# Patient Record
Sex: Male | Born: 1945 | Race: White | Hispanic: No | Marital: Single | Smoking: Never smoker
Health system: Southern US, Community
[De-identification: ages and names within clinical notes are randomized; demographics above are authoritative.]

## PROBLEM LIST (undated history)

## (undated) DIAGNOSIS — I10 Essential (primary) hypertension: Secondary | ICD-10-CM

## (undated) DIAGNOSIS — E785 Hyperlipidemia, unspecified: Secondary | ICD-10-CM

## (undated) DIAGNOSIS — E119 Type 2 diabetes mellitus without complications: Secondary | ICD-10-CM

## (undated) HISTORY — PX: KNEE SURGERY: SHX244

## (undated) HISTORY — PX: PACEMAKER PLACEMENT: SHX43

## (undated) HISTORY — PX: CARDIAC SURGERY: SHX584

## (undated) HISTORY — PX: ABDOMINAL SURGERY: SHX537

---

## 2017-01-13 DIAGNOSIS — G4733 Obstructive sleep apnea (adult) (pediatric): Secondary | ICD-10-CM | POA: Insufficient documentation

## 2017-01-13 DIAGNOSIS — I10 Essential (primary) hypertension: Secondary | ICD-10-CM | POA: Insufficient documentation

## 2017-01-13 DIAGNOSIS — I2510 Atherosclerotic heart disease of native coronary artery without angina pectoris: Secondary | ICD-10-CM | POA: Insufficient documentation

## 2017-01-13 DIAGNOSIS — E119 Type 2 diabetes mellitus without complications: Secondary | ICD-10-CM | POA: Insufficient documentation

## 2017-01-21 ENCOUNTER — Emergency Department (HOSPITAL_COMMUNITY): Payer: Medicare PPO

## 2017-01-21 ENCOUNTER — Emergency Department (HOSPITAL_COMMUNITY)
Admission: EM | Admit: 2017-01-21 | Discharge: 2017-01-21 | Disposition: A | Discharge status: Other Acute Inpatient Hospital | Payer: Medicare PPO | Attending: Emergency Medicine | Admitting: Emergency Medicine

## 2017-01-21 ENCOUNTER — Telehealth (HOSPITAL_BASED_OUTPATIENT_CLINIC_OR_DEPARTMENT_OTHER): Payer: Self-pay | Admitting: *Deleted

## 2017-01-21 ENCOUNTER — Encounter (HOSPITAL_COMMUNITY): Payer: Self-pay | Admitting: Emergency Medicine

## 2017-01-21 DIAGNOSIS — Z951 Presence of aortocoronary bypass graft: Secondary | ICD-10-CM

## 2017-01-21 DIAGNOSIS — A419 Sepsis, unspecified organism: Secondary | ICD-10-CM | POA: Diagnosis not present

## 2017-01-21 DIAGNOSIS — R509 Fever, unspecified: Secondary | ICD-10-CM | POA: Diagnosis present

## 2017-01-21 DIAGNOSIS — E119 Type 2 diabetes mellitus without complications: Secondary | ICD-10-CM | POA: Insufficient documentation

## 2017-01-21 HISTORY — DX: Type 2 diabetes mellitus without complications: E11.9

## 2017-01-21 LAB — BLOOD CULTURE ID PANEL (REFLEXED)
Acinetobacter baumannii: NOT DETECTED
CANDIDA ALBICANS: NOT DETECTED
CANDIDA GLABRATA: NOT DETECTED
Candida krusei: NOT DETECTED
Candida parapsilosis: NOT DETECTED
Candida tropicalis: NOT DETECTED
ENTEROBACTER CLOACAE COMPLEX: NOT DETECTED
ENTEROBACTERIACEAE SPECIES: NOT DETECTED
ENTEROCOCCUS SPECIES: NOT DETECTED
Escherichia coli: NOT DETECTED
Haemophilus influenzae: NOT DETECTED
Klebsiella oxytoca: NOT DETECTED
Klebsiella pneumoniae: NOT DETECTED
LISTERIA MONOCYTOGENES: NOT DETECTED
Methicillin resistance: NOT DETECTED
NEISSERIA MENINGITIDIS: NOT DETECTED
Proteus species: NOT DETECTED
Pseudomonas aeruginosa: NOT DETECTED
STREPTOCOCCUS AGALACTIAE: NOT DETECTED
STREPTOCOCCUS PYOGENES: NOT DETECTED
STREPTOCOCCUS SPECIES: NOT DETECTED
Serratia marcescens: NOT DETECTED
Staphylococcus aureus (BCID): DETECTED — AB
Staphylococcus species: DETECTED — AB
Streptococcus pneumoniae: NOT DETECTED

## 2017-01-21 LAB — URINALYSIS, ROUTINE W REFLEX MICROSCOPIC
BACTERIA UA: NONE SEEN
Bilirubin Urine: NEGATIVE
Glucose, UA: 150 mg/dL — AB
HGB URINE DIPSTICK: NEGATIVE
Ketones, ur: 20 mg/dL — AB
LEUKOCYTES UA: NEGATIVE
Nitrite: NEGATIVE
PROTEIN: 30 mg/dL — AB
SPECIFIC GRAVITY, URINE: 1.028 (ref 1.005–1.030)
pH: 5 (ref 5.0–8.0)

## 2017-01-21 LAB — CBC WITH DIFFERENTIAL/PLATELET
BASOS PCT: 0 %
Basophils Absolute: 0 10*3/uL (ref 0.0–0.1)
EOS ABS: 0 10*3/uL (ref 0.0–0.7)
Eosinophils Relative: 0 %
HEMATOCRIT: 26.3 % — AB (ref 39.0–52.0)
Hemoglobin: 9.1 g/dL — ABNORMAL LOW (ref 13.0–17.0)
Lymphocytes Relative: 5 %
Lymphs Abs: 0.8 10*3/uL (ref 0.7–4.0)
MCH: 29.6 pg (ref 26.0–34.0)
MCHC: 34.6 g/dL (ref 30.0–36.0)
MCV: 85.7 fL (ref 78.0–100.0)
MONO ABS: 0.8 10*3/uL (ref 0.1–1.0)
MONOS PCT: 5 %
NEUTROS ABS: 15.1 10*3/uL — AB (ref 1.7–7.7)
Neutrophils Relative %: 90 %
PLATELETS: 176 10*3/uL (ref 150–400)
RBC: 3.07 MIL/uL — ABNORMAL LOW (ref 4.22–5.81)
RDW: 14.4 % (ref 11.5–15.5)
WBC: 16.7 10*3/uL — AB (ref 4.0–10.5)

## 2017-01-21 LAB — I-STAT ARTERIAL BLOOD GAS, ED
Acid-base deficit: 8 mmol/L — ABNORMAL HIGH (ref 0.0–2.0)
Bicarbonate: 15.6 mmol/L — ABNORMAL LOW (ref 20.0–28.0)
O2 Saturation: 96 %
PCO2 ART: 25.4 mmHg — AB (ref 32.0–48.0)
PH ART: 7.398 (ref 7.350–7.450)
Patient temperature: 98.6
TCO2: 16 mmol/L — ABNORMAL LOW (ref 22–32)
pO2, Arterial: 79 mmHg — ABNORMAL LOW (ref 83.0–108.0)

## 2017-01-21 LAB — COMPREHENSIVE METABOLIC PANEL
ALBUMIN: 2.6 g/dL — AB (ref 3.5–5.0)
ALK PHOS: 46 U/L (ref 38–126)
ALT: 29 U/L (ref 17–63)
AST: 34 U/L (ref 15–41)
Anion gap: 12 (ref 5–15)
BILIRUBIN TOTAL: 1.6 mg/dL — AB (ref 0.3–1.2)
BUN: 23 mg/dL — ABNORMAL HIGH (ref 6–20)
CALCIUM: 7.9 mg/dL — AB (ref 8.9–10.3)
CO2: 20 mmol/L — AB (ref 22–32)
CREATININE: 1.05 mg/dL (ref 0.61–1.24)
Chloride: 94 mmol/L — ABNORMAL LOW (ref 101–111)
GFR calc non Af Amer: >60 mL/min (ref >60)
GLUCOSE: 303 mg/dL — AB (ref 65–99)
Potassium: 3.4 mmol/L — ABNORMAL LOW (ref 3.5–5.1)
Sodium: 126 mmol/L — ABNORMAL LOW (ref 135–145)
TOTAL PROTEIN: 5.7 g/dL — AB (ref 6.5–8.1)

## 2017-01-21 LAB — PROTIME-INR
INR: 1.1
Prothrombin Time: 14.1 seconds (ref 11.4–15.2)

## 2017-01-21 LAB — I-STAT CG4 LACTIC ACID, ED
LACTIC ACID, VENOUS: 1.66 mmol/L (ref 0.5–1.9)
Lactic Acid, Venous: 2.23 mmol/L (ref 0.5–1.9)

## 2017-01-21 LAB — PROCALCITONIN: PROCALCITONIN: 0.88 ng/mL

## 2017-01-21 LAB — TROPONIN I: Troponin I: 0.03 ng/mL (ref <0.03)

## 2017-01-21 MED ORDER — PIPERACILLIN-TAZOBACTAM 3.375 G IVPB: 3.3750 g | Freq: Three times a day (TID) | INTRAVENOUS | Status: DC

## 2017-01-21 MED ORDER — VANCOMYCIN HCL IN DEXTROSE 1-5 GM/200ML-% IV SOLN
1000.0000 mg | Freq: Once | INTRAVENOUS | Status: DC
  Filled 2017-01-21: qty 200

## 2017-01-21 MED ORDER — VANCOMYCIN HCL 10 G IV SOLR: 1250.0000 mg | Freq: Two times a day (BID) | INTRAVENOUS | Status: DC

## 2017-01-21 MED ORDER — PIPERACILLIN-TAZOBACTAM 3.375 G IVPB 30 MIN
3.3750 g | Freq: Once | INTRAVENOUS | Status: AC
  Administered 2017-01-21: 3.375 g via INTRAVENOUS
  Filled 2017-01-21: qty 50

## 2017-01-21 MED ORDER — SODIUM CHLORIDE 0.9 % IV BOLUS (SEPSIS)
1000.0000 mL | Freq: Once | INTRAVENOUS | Status: AC
  Administered 2017-01-21: 1000 mL via INTRAVENOUS

## 2017-01-21 MED ORDER — SODIUM CHLORIDE 0.9 % IV BOLUS (SEPSIS): 1000.0000 mL | Freq: Once | INTRAVENOUS | Status: DC

## 2017-01-21 MED ORDER — IOPAMIDOL (ISOVUE-370) INJECTION 76%
INTRAVENOUS | Status: AC
  Administered 2017-01-21: 100 mL
  Filled 2017-01-21: qty 100

## 2017-01-21 MED ORDER — VANCOMYCIN HCL 10 G IV SOLR
2000.0000 mg | Freq: Once | INTRAVENOUS | Status: AC
  Administered 2017-01-21: 2000 mg via INTRAVENOUS
  Filled 2017-01-21: qty 2000

## 2017-01-21 NOTE — ED Notes (Signed)
Tried calling [facility] x2 to get more information about patient since they did not send any documentation, no one answered either time.   

## 2017-01-21 NOTE — ED Provider Notes (Signed)
[facility]-EMERGENCY DEPT Provider Note   CSN: [id number] Arrival date & time: 01/21/17  0416     History   Chief Complaint Chief Complaint  Patient presents with  . Fever  . Weakness    HPI Allen Alexander is a 71 y.o. male.  HPI Patient had four-vessel cardiac bypass surgery at [facility]. He was discharged yesterday to ECF. On arrival to [facility] patient was found to be ill in appearance. Temperature was 102.  He was sent to the emergency department. Patient reports he just doesn't feel good. He denies any focal pain. He denies chest pain or shortness of breath. Past Medical History:  Diagnosis Date  . Diabetes mellitus without complication (HCC)     There are no active problems to display for this patient.   Past Surgical History:  Procedure Laterality Date  . CARDIAC SURGERY         Home Medications    Prior to Admission medications   Not on File    Family History No family history on file.  Social History Social History  Substance Use Topics  . Smoking status: Not on file  . Smokeless tobacco: Not on file  . Alcohol use Not on file     Allergies   Patient has no allergy information on record.   Review of Systems Review of Systems  10 Systems reviewed and are negative for acute change except as noted in the HPI. Physical Exam Updated Vital Signs BP (!) 121/56   Pulse 97   Temp (!) 102.6 F (39.2 C) (Oral)   Resp 17   Ht  (1.803 m)   Wt 117.9 kg (260 lb)   SpO2 97%   BMI 36.26 kg/m   Physical Exam  Constitutional:  Patient is ill in appearance. He is moderately confused. Slightly diaphoretic. Tachypnea.  HENT:  Head: Normocephalic and atraumatic.  Nose: Nose normal.  Mouth/Throat: Oropharynx is clear and moist.  Eyes: Pupils are equal, round, and reactive to light. EOM are normal.  Neck: Neck supple.  Cardiovascular:  Tachycardia. Distant heart sounds. No gross rub murmur gallop.  Pulmonary/Chest:  Tachypnea.  No gross rail, rhonchi or wheeze. Chest wall incision is healing well. No significant erythema or drainage or discharge.  Abdominal: Soft. Bowel sounds are normal. He exhibits no distension. There is no guarding.  Genitourinary: Penis normal.  Musculoskeletal: Normal range of motion.  Patient has vein harvest site on the left lower extremity. Incisions are clean dry and intact. Some diffuse ecchymosis consistent with postoperative findings but no significant erythema or appearance of cellulitis. Mild peripheral edema bilaterally.  Neurological:  Patient is awake and interacts but he does seem mildly to moderately confused. He follows commands but seems very fatigued. No localizing motor dysfunction.  Skin: Skin is warm.  Skin is warm and diaphoretic. No evident rashes.     ED Treatments / Results  Labs (all labs ordered are listed, but only abnormal results are displayed) Labs Reviewed  COMPREHENSIVE METABOLIC PANEL - Abnormal; Notable for the following:       Result Value   Sodium 126 (*)    Potassium 3.4 (*)    Chloride 94 (*)    CO2 20 (*)    Glucose, Bld 303 (*)    BUN 23 (*)    Calcium 7.9 (*)    Total Protein 5.7 (*)    Albumin 2.6 (*)    Total Bilirubin 1.6 (*)    All other components within normal  limits  CBC WITH DIFFERENTIAL/PLATELET - Abnormal; Notable for the following:    WBC 16.7 (*)    RBC 3.07 (*)    Hemoglobin 9.1 (*)    HCT 26.3 (*)    Neutro Abs 15.1 (*)    All other components within normal limits  URINALYSIS, ROUTINE W REFLEX MICROSCOPIC - Abnormal; Notable for the following:    Glucose, UA 150 (*)    Ketones, ur 20 (*)    Protein, ur 30 (*)    Squamous Epithelial / LPF 0-5 (*)    All other components within normal limits  TROPONIN I - Abnormal; Notable for the following:    Troponin I 0.03 (*)    All other components within normal limits  I-STAT CG4 LACTIC ACID, ED - Abnormal; Notable for the following:    Lactic Acid, Venous 2.23 (*)    All other  components within normal limits  I-STAT ARTERIAL BLOOD GAS, ED - Abnormal; Notable for the following:    pCO2 arterial 25.4 (*)    pO2, Arterial 79.0 (*)    Bicarbonate 15.6 (*)    TCO2 16 (*)    Acid-base deficit 8.0 (*)    All other components within normal limits  CULTURE, BLOOD (ROUTINE X 2)  CULTURE, BLOOD (ROUTINE X 2)  PROTIME-INR  PROCALCITONIN  BLOOD GAS, ARTERIAL  I-STAT CG4 LACTIC ACID, ED  I-STAT CG4 LACTIC ACID, ED  I-STAT CG4 LACTIC ACID, ED    EKG  EKG Interpretation  Date/Time:  Friday January 21 2017 04:24:29 EDT Ventricular Rate:  115 PR Interval:    QRS Duration: 109 QT Interval:  383 QTC Calculation: 530 R Axis:   -163 Text Interpretation:  Sinus tachycardia with irregular rate Abnormal R-wave progression, late transition Inferior infarct, old Prolonged QT interval agree Confirmed by Arby Barrette [phone]) on 01/21/2017 4:33:28 AM Also confirmed by Arby Barrette [phone]), editor Madalyn Rob [phone])  on 01/21/2017 6:55:57 AM       Radiology Ct Angio Chest Pe W/cm &/or Wo Cm  Result Date: 01/21/2017 CLINICAL DATA:  Recent surgery.  Fever and cough. EXAM: CT ANGIOGRAPHY CHEST WITH CONTRAST TECHNIQUE: Multidetector CT imaging of the chest was performed using the standard protocol during bolus administration of intravenous contrast. Multiplanar CT image reconstructions and MIPs were obtained to evaluate the vascular anatomy. CONTRAST:  One hundred eighty-five cc Isovue 370. Initial, 100 cc was injected with sub optimal quality. Subsequently, 85 cc was injected. COMPARISON:  None. FINDINGS: Cardiovascular: There are no convincing filling defects in the pulmonary arterial tree to suggest acute pulmonary thromboembolism. There is mixing and streak artifact across some of the lower lobe vessels. For example see image 74 of series 17. There is low-density streaking across both the descending thoracic aorta and lower lobe pulmonary artery branches. There is no  obvious aortic dissection or aneurysm. There is some gas in the mediastinum consistent with recent sternotomy and mediastinal surgery. Great vessels are grossly patent within the confines of the examination. Saphenous vein bypass grafts are grossly opacified with contrast. Coronary artery calcifications are noted. A right subclavian pacemaker device is in place. Leads are noted in the right side of the heart. The heart is enlarged. Atherosclerotic calcifications in the aorta. Mediastinum/Nodes: Small mediastinal nodes. Gas is present consistent with recent surgery. Minimal peribronchovascular soft tissue thickening in the hilar regions is nonspecific. Patchy density at the right apex measures 8 mm. See image 31 of series 18. Lungs/Pleura: No pneumothorax. No pleural effusion. Dependent atelectasis. Upper  Abdomen: A cystic lesion in the pancreatic tail is partially imaged measuring 7 cm. Solid elements below the lower extent of the study may be present and complete imaging of this abnormality is recommended. Musculoskeletal: Post sternotomy with wires in place. No definite acute rib fracture. No vertebral compression deformity. Review of the MIP images confirms the above findings. IMPRESSION: No evidence of acute pulmonary thromboembolism. Postoperative changes in the mediastinum Partially imaged cystic lesion in the tail of the pancreas measures up to 7 cm. Solid elements may be present below the lower limit of the study and complete imaging of this abnormality is recommended. CT abdomen pelvis with contrast and pancreatic protocol is recommended. 8 mm indeterminate opacity at the right lung apex. Initial follow-up by chest CT without contrast is recommended in 3 months to confirm persistence. This recommendation follows the consensus statement: Recommendations for the Management of Subsolid Pulmonary Nodules Detected at CT: A Statement from the Fleischner Society as published in Radiology 2013; 266:304-317. Aortic  Atherosclerosis (ICD10-I70.0). Electronically Signed   By: Jolaine Click M.D.   On: 01/21/2017 06:58   Dg Chest Port 1 View  Result Date: 01/21/2017 CLINICAL DATA:  Acute onset of shortness of breath. Initial encounter. EXAM: PORTABLE CHEST 1 VIEW COMPARISON:  None. FINDINGS: The lungs are well-aerated and clear. There is no evidence of focal opacification, pleural effusion or pneumothorax. The cardiomediastinal silhouette is borderline enlarged. The patient is status post median sternotomy. A pacemaker is noted overlying the right chest wall, with leads ending overlying the right ventricle. No acute osseous abnormalities are seen. IMPRESSION: Borderline cardiomegaly.  Lungs remain grossly clear. Electronically Signed   By: Roanna Raider M.D.   On: 01/21/2017 05:15    Procedures Procedures (including critical care time) CRITICAL CARE Performed by: Arby Barrette   Total critical care time: 45 minutes  Critical care time was exclusive of separately billable procedures and treating other patients.  Critical care was necessary to treat or prevent imminent or life-threatening deterioration.  Critical care was time spent personally by me on the following activities: development of treatment plan with patient and/or surrogate as well as nursing, discussions with consultants, evaluation of patient's response to treatment, examination of patient, obtaining history from patient or surrogate, ordering and performing treatments and interventions, ordering and review of laboratory studies, ordering and review of radiographic studies, pulse oximetry and re-evaluation of patient's condition. Medications Ordered in ED Medications  sodium chloride 0.9 % bolus 1,000 mL (0 mLs Intravenous Stopped 01/21/17 0538)    And  sodium chloride 0.9 % bolus 1,000 mL (0 mLs Intravenous Stopped 01/21/17 0538)    And  sodium chloride 0.9 % bolus 1,000 mL (0 mLs Intravenous Stopped 01/21/17 0700)    And  sodium  chloride 0.9 % bolus 1,000 mL (not administered)  vancomycin (VANCOCIN) 1,250 mg in sodium chloride 0.9 % 250 mL IVPB (not administered)  piperacillin-tazobactam (ZOSYN) IVPB 3.375 g (not administered)  piperacillin-tazobactam (ZOSYN) IVPB 3.375 g (0 g Intravenous Stopped 01/21/17 0528)  vancomycin (VANCOCIN) 2,000 mg in sodium chloride 0.9 % 500 mL IVPB (0 mg Intravenous Stopped 01/21/17 0725)  iopamidol (ISOVUE-370) 76 % injection (100 mLs  Contrast Given 01/21/17 0617)     Initial Impression / Assessment and Plan / ED Course  I have reviewed the triage vital signs and the nursing notes.  Pertinent labs & imaging results that were available during my care of the patient were reviewed by me and considered in my medical decision making (see chart  for details).    Consult: Review Dr. Justice Britain CT surgeon at [facility], recommends CT PE study and continuing septic workup and treatment. Okay for admission to [facility] if needs further stabilization. Can continue to consult for ongoing guidance. Consult: Dr. Helyn Numbers intensivist. Patient was evaluated by PA-C in the emergency department and after discussing the case, they determined patient should be returned to [facility] for ongoing treatment by CT surgery for continuity of care.  07:22 patient improved compared to on arrival. Mental status is now much clearer. Patient reports he just doesn't feel good. No localizing pain. Consult: Review Dr. Nevada Crane CT surgery [facility]. Accepts the patient for transfer. Final Clinical Impressions(s) / ED Diagnoses   Final diagnoses:  Sepsis, due to unspecified organism [facility])  Hx of CABG  Patient presents as all and above. Findings are consistent with sepsis. Pulmonary embolus is ruled out. Etiology of sepsis uncertain at this time. With sepsis resuscitation measures, patient is showing improving mental status. Blood pressures have stabilized. Plan will be for transfer to [facility] for continuity of care with his cardiothoracic surgeon.  New Prescriptions New Prescriptions   No medications on file     Arby Barrette, MD 01/21/17 [phone]

## 2017-01-21 NOTE — ED Notes (Signed)
Family bedside, RN briefly updated, Provider notified and will be in shortly

## 2017-01-21 NOTE — Progress Notes (Signed)
Pharmacy Antibiotic Note  Allen Alexander is a 71 y.o. male admitted on 01/21/2017 with fevers, possible sepsis.  Pharmacy has been consulted for Vancomycin and Zosyn  dosing.  Plan: Vancomycin 2 g IV now, then 1250 mg IV q12h Zosyn 3.375 g IV q8h    Height:  (180.3 cm) Weight: 260 lb (117.9 kg) IBW/kg (Calculated) : 75.3  Temp (24hrs), Avg:102.6 F (39.2 C), Min:102.6 F (39.2 C), Max:102.6 F (39.2 C)   Recent Labs Lab 01/21/17 0429 01/21/17 0437  WBC 16.7*  --   CREATININE 1.05  --   LATICACIDVEN  --  2.23*    Estimated Creatinine Clearance: 84.2 mL/min (by C-G formula based on SCr of 1.05 mg/dL).    Not on File  Eddie Candle 01/21/2017 7:26 AM

## 2017-01-21 NOTE — ED Provider Notes (Signed)
9:30 a.m. Patient awake alert Glasgow Coma Score 15. Stable for transport [facility]   [place], [place], MD 01/21/17 [phone]

## 2017-01-21 NOTE — ED Notes (Signed)
Tried calling [facility] x2 to get more information about patient since they did not send any documentation, no one answered either time.

## 2017-01-21 NOTE — ED Triage Notes (Addendum)
Pt came from [facility] (having just moved in today.)  Was discharged from [facility] after having open heart surgery last week.  Pt complains of weakness and fever.  Pt was alert and oriented however had trouble given history.  Pt was given  of Tylenol by EMS

## 2017-01-23 LAB — CULTURE, BLOOD (ROUTINE X 2): Special Requests: ADEQUATE

## 2017-01-24 ENCOUNTER — Telehealth: Payer: Self-pay | Admitting: Emergency Medicine

## 2017-01-24 NOTE — Telephone Encounter (Signed)
Post ED Visit - Positive Culture Follow-up  Culture report reviewed by antimicrobial stewardship pharmacist:   Enzo Bi, Pharm.D.  Celedonio Miyamoto, Pharm.D., BCPS AQ-ID  Garvin Fila, Pharm.D., BCPS  Georgina Pillion, [address].D., BCPS  [place], [address].D., BCPS, AAHIVP  Estella Husk, Pharm.D., BCPS, AAHIVP  Lysle Pearl, PharmD, BCPS  Casilda Carls, PharmD, BCPS  Pollyann Samples, PharmD, BCPS  Positive blood culture Transferred to [facility] , faxed to [facility] @ [phone]  Berle Mull 01/24/2017, 1:10 PM

## 2017-07-05 ENCOUNTER — Telehealth (HOSPITAL_COMMUNITY): Payer: Self-pay

## 2017-07-05 NOTE — Telephone Encounter (Signed)
Called patient in regards to Cardiac Rehab - Scheduled orientation on 08/30/2017 at 8:30am. Patient will attend the 1:15pm exc class. Mailed packet.

## 2017-07-14 ENCOUNTER — Ambulatory Visit: Payer: Non-veteran care | Admitting: Podiatry

## 2017-07-21 ENCOUNTER — Ambulatory Visit (INDEPENDENT_AMBULATORY_CARE_PROVIDER_SITE_OTHER): Payer: No Typology Code available for payment source

## 2017-07-21 ENCOUNTER — Encounter: Payer: Self-pay | Admitting: Podiatry

## 2017-07-21 ENCOUNTER — Ambulatory Visit (INDEPENDENT_AMBULATORY_CARE_PROVIDER_SITE_OTHER): Payer: No Typology Code available for payment source | Admitting: Podiatry

## 2017-07-21 DIAGNOSIS — M79675 Pain in left toe(s): Secondary | ICD-10-CM

## 2017-07-21 DIAGNOSIS — M79674 Pain in right toe(s): Secondary | ICD-10-CM

## 2017-07-21 DIAGNOSIS — B351 Tinea unguium: Secondary | ICD-10-CM | POA: Diagnosis not present

## 2017-07-21 DIAGNOSIS — M2022 Hallux rigidus, left foot: Secondary | ICD-10-CM | POA: Diagnosis not present

## 2017-07-21 DIAGNOSIS — M2012 Hallux valgus (acquired), left foot: Secondary | ICD-10-CM | POA: Diagnosis not present

## 2017-07-21 DIAGNOSIS — E1149 Type 2 diabetes mellitus with other diabetic neurological complication: Secondary | ICD-10-CM

## 2017-07-24 NOTE — Progress Notes (Signed)
Subjective:   Patient ID: Allen Alexander, male   DOB: 72 y.o.   MRN: [id number]   HPI 72 year old male presents the office today for 2 concerns.  His primary concern is pain to the left bunion which is been ongoing for about 4-5 years and has been getting worse.  He states that time the area will swell and get very painful to the nail which shoes he wears.  Denies any recent injury or trauma denies any recent treatment for this other than change in shoes.  Also he presents today for concerns of thick, discolored toenails that he cannot trim himself.  Denies any redness or drainage from the toenail sites.  Does have neuropathy.  His last A1c was 7.6.  He denies any claudication symptoms.  Denies any ulceration.  He has no other concerns.   Review of Systems  All other systems reviewed and are negative.  Past Medical History:  Diagnosis Date  . Diabetes mellitus without complication [facility])     Past Surgical History:  Procedure Laterality Date  . CARDIAC SURGERY       Current Outpatient Medications:  .  polyethylene glycol (MIRALAX / GLYCOLAX) packet, Take 17 g by mouth., Disp: , Rfl:  .  potassium chloride (K-DUR,KLOR-CON) 10 MEQ tablet, Take by mouth., Disp: , Rfl:  .  aspirin 81 MG chewable tablet, Chew 162 mg by mouth daily., Disp: , Rfl:  .  atorvastatin (LIPITOR) 40 MG tablet, Take 40 mg by mouth daily., Disp: , Rfl:  .  Cholecalciferol (VITAMIN D) 2000 units CAPS, Take 2,000 Units by mouth every morning., Disp: , Rfl:  .  furosemide (LASIX) 20 MG tablet, Take 20 mg by mouth daily. For 7 days; started on 01-21-17, Disp: , Rfl:  .  Inositol Niacinate 100 MG TABS, Take 400 mg by mouth at bedtime., Disp: , Rfl:  .  insulin aspart (NOVOLOG) 100 UNIT/ML injection, Inject 2-10 Units into the skin 3 (three) times daily before meals. Sliding scale. 151-200=2 units. 201-250=4 units.  251-300= 6 units.  351-400=10 units. Greater than 400; call MD, Disp: , Rfl:  .  insulin glargine (LANTUS)  100 UNIT/ML injection, Inject 30 Units into the skin at bedtime., Disp: , Rfl:  .  metFORMIN (GLUCOPHAGE-XR) 500 MG 24 hr tablet, Take 2,000 mg by mouth daily with breakfast., Disp: , Rfl:  .  metoprolol tartrate (LOPRESSOR) 25 MG tablet, Take 25 mg by mouth 2 (two) times daily., Disp: , Rfl:  .  Niacin-Inositol 400-100 MG CAPS, Take by mouth., Disp: , Rfl:  .  sennosides-docusate sodium (SENOKOT-S) 8.6-50 MG tablet, Take 2 tablets by mouth at bedtime., Disp: , Rfl:   Allergies  Allergen Reactions  . Hydrochlorothiazide Other (See Comments)  . Lisinopril Other (See Comments)  . Niacin Itching    Pt states he can tolerate Niacin    Social History   Socioeconomic History  . Marital status: Single    Spouse name: Not on file  . Number of children: Not on file  . Years of education: Not on file  . Highest education level: Not on file  Occupational History  . Not on file  Social Needs  . Financial resource strain: Not on file  . Food insecurity:    Worry: Not on file    Inability: Not on file  . Transportation needs:    Medical: Not on file    Non-medical: Not on file  Tobacco Use  . Smoking status: Never Smoker  . Smokeless  tobacco: Never Used  Substance and Sexual Activity  . Alcohol use: Not Currently  . Drug use: Never  . Sexual activity: Not on file  Lifestyle  . Physical activity:    Days per week: Not on file    Minutes per session: Not on file  . Stress: Not on file  Relationships  . Social connections:    Talks on phone: Not on file    Gets together: Not on file    Attends religious service: Not on file    Active member of club or organization: Not on file    Attends meetings of clubs or organizations: Not on file    Relationship status: Not on file  . Intimate partner violence:    Fear of current or ex partner: Not on file    Emotionally abused: Not on file    Physically abused: Not on file    Forced sexual activity: Not on file  Other Topics Concern  .  Not on file  Social History Narrative  . Not on file         Objective:  Physical Exam  General: AAO x3, NAD  Dermatological:Nails are hypertrophic, dystrophic, brittle, discolored, elongated 10. No surrounding redness or drainage. Tenderness nails 1-5 bilaterally. No open lesions or pre-ulcerative lesions are identified today.  Vascular: Dorsalis Pedis artery and Posterior Tibial artery pedal pulses are 2/4 bilateral with immedate capillary fill time.  There is no pain with calf compression, swelling, warmth, erythema.   Neruologic: Sensation mildly decreased at the toes with Allen Alexander monofilament.  Musculoskeletal: There is minimal swelling to the area and there is mild erythema for which is been rubbing inside shoes.  There is almost no range of motion of the first MPJ present on the left foot.  Dorsal bony prominence is also palpable as well as the dorsal medial prominence.  Muscular strength 5/5 in all groups tested bilateral.  Gait: Unassisted, Nonantalgic.     Assessment:   72 year old male with hallux rigidus left foot; symptomatic onychomycosis     Plan:  -Treatment options discussed including all alternatives, risks, and complications -Etiology of symptoms were discussed -X-rays were obtained and reviewed with the patient.  Significant arthritic changes present of the first MPJ on the left foot there is dorsal spurring and osteophyte formation. -In regards to the hallux rigidus we discussed both conservative as well as surgical treatment options.  I did dispense today graphite insert inside of his shoes.  We also discussed changing shoes as well as offloading pads.  We also discussed surgical intervention but he has had a recent quadruple bypass with his heart.  He may need to have cardiac clearance prior to any surgical intervention.  We just discussed the first MPJ arthrodesis in the future should symptoms continue.  He has very minimal discomfort on exam today so  therefore we held off on steroid injection but should it become painful we can always do this. -Nails are sharply debrided x10 without any complications or bleeding.  Allen Alexander DPM

## 2017-08-30 ENCOUNTER — Encounter (HOSPITAL_COMMUNITY)
Admission: RE | Admit: 2017-08-30 | Discharge: 2017-08-30 | Disposition: A | Discharge status: Home / Self Care | Payer: Medicare PPO | Source: Ambulatory Visit | Attending: Cardiology | Admitting: Cardiology

## 2017-08-30 VITALS — Ht 70.0 in | Wt 278.7 lb

## 2017-08-30 DIAGNOSIS — Z951 Presence of aortocoronary bypass graft: Secondary | ICD-10-CM | POA: Insufficient documentation

## 2017-08-30 DIAGNOSIS — E119 Type 2 diabetes mellitus without complications: Secondary | ICD-10-CM | POA: Insufficient documentation

## 2017-08-31 NOTE — Progress Notes (Signed)
Allen Alexander 72 y.o. male DOB: 1945-09-16 MRN: [id number]      Nutrition Note  1. S/P CABG x 4    Past Medical History:  Diagnosis Date  . Diabetes mellitus without complication (HCC)    Meds reviewed. Novolog, Lantus, Metformin noted  HT: Ht Readings from Last 1 Encounters:  08/30/17  (1.778 m)    WT: Wt Readings from Last 5 Encounters:  08/30/17 278 lb 10.6 oz (126.4 kg)  01/21/17 260 lb (117.9 kg)     Body mass index is 39.98 kg/m.   Current tobacco use? No   Labs:  Lipid Panel  No results found for: CHOL, TRIG, HDL, CHOLHDL, VLDL, LDLCALC, LDLDIRECT  No results found for: HGBA1C CBG (last 3)  No results for input(s): GLUCAP in the last 72 hours.  Nutrition Note Spoke with pt. Nutrition plan and goals reviewed with pt. Pt is following Step 1 of the Therapeutic Lifestyle Changes diet. Pt wants to lose wt. Per discussion, pt's highest wt several years ago was 315 lb. Pt reports losing 45 lb from his highest wt and maintaining wt loss until recently. Wt loss tips reviewed. Pt is diabetic. No recent A1c noted. Pt reports his last A1c was 7.1. Pt checks CBG's 2-3 times a day. Fasting CBG's reportedly 155-185 mg/dL. Pt expressed understanding of the information reviewed. Pt aware of nutrition education classes offered and plans on attending nutrition classes.  Nutrition Diagnosis ? Food-and nutrition-related knowledge deficit related to lack of exposure to information as related to diagnosis of: ? CVD ? DM  ? Obesity related to excessive energy intake as evidenced by a Body mass index is 39.98 kg/m.  Nutrition Intervention ? Pt's individual nutrition plan and goals reviewed with pt.  Nutrition Goal(s):  ? Pt to identify and limit food sources of saturated fat, trans fat, and sodium ? Pt to identify food quantities necessary to achieve weight loss of 6-24 lb (2.7-10.9 kg) at graduation from cardiac rehab. Goal wt of 235 lb desired.   Plan:  Pt to attend  nutrition classes ? Nutrition I ? Nutrition II ? Portion Distortion  Will provide client-centered nutrition education as part of interdisciplinary care.   Monitor and evaluate progress toward nutrition goal with team.  Mickle Plumb, M.Ed, RD, LDN, CDE 08/31/2017 3:42 PM

## 2017-08-31 NOTE — Progress Notes (Signed)
Cardiac Individual Treatment Plan  Patient Details  Name: Allen Alexander MRN: [id number] Date of Birth: 03-29-1946 Referring Provider:   Flowsheet Row CARDIAC REHAB PHASE II ORIENTATION from 08/30/2017 in [facility] CARDIAC [facility]  Referring Provider  Humpreys,Holly MD ([facility]'s coverage)      Initial Encounter Date:  Flowsheet Row CARDIAC REHAB PHASE II ORIENTATION from 08/30/2017 in [facility] CARDIAC REHAB  Date  08/30/17  Referring Provider  Humpreys,Holly MD ([facility]'s coverage)      Visit Diagnosis: S/P CABG x 4  Patient's Home Medications on Admission:  Current Outpatient Medications:  .  aspirin 81 MG chewable tablet, Chew 162 mg by mouth daily., Disp: , Rfl:  .  atorvastatin (LIPITOR) 40 MG tablet, Take 40 mg by mouth daily., Disp: , Rfl:  .  Cholecalciferol (VITAMIN D) 2000 units CAPS, Take 2,000 Units by mouth every morning., Disp: , Rfl:  .  diphenhydrAMINE (BENADRYL) 25 MG tablet, Take 25 mg by mouth every 6 (six) hours as needed., Disp: , Rfl:  .  insulin aspart (NOVOLOG) 100 UNIT/ML injection, Inject 2-10 Units into the skin 3 (three) times daily before meals. Sliding scale. 151-200=2 units. 201-250=4 units.  251-300= 6 units.  351-400=10 units. Greater than 400; call MD, Disp: , Rfl:  .  insulin glargine (LANTUS) 100 UNIT/ML injection, Inject 25 Units into the skin at bedtime. , Disp: , Rfl:  .  isosorbide mononitrate (IMDUR) 60 MG 24 hr tablet, Take 60 mg by mouth daily., Disp: , Rfl:  .  Melatonin 10 MG TABS, Take 1 tablet by mouth at bedtime as needed., Disp: , Rfl:  .  metFORMIN (GLUCOPHAGE-XR) 500 MG 24 hr tablet, Take 2,000 mg by mouth daily with breakfast., Disp: , Rfl:  .  metoprolol tartrate (LOPRESSOR) 25 MG tablet, Take 12.5 mg by mouth 2 (two) times daily. , Disp: , Rfl:  .  Multiple Vitamins-Minerals (MULTIVITAMIN WITH MINERALS) tablet, Take 1 tablet by mouth daily., Disp: , Rfl:  .  niacin 100 MG tablet, Take 100 mg by  mouth at bedtime., Disp: , Rfl:  .  furosemide (LASIX) 20 MG tablet, Take 20 mg by mouth daily. For 7 days; started on 01-21-17, Disp: , Rfl:  .  potassium chloride (K-DUR,KLOR-CON) 10 MEQ tablet, Take by mouth., Disp: , Rfl:   Past Medical History: Past Medical History:  Diagnosis Date  . Diabetes mellitus without complication (HCC)     Tobacco Use: Social History   Tobacco Use  Smoking Status Never Smoker  Smokeless Tobacco Never Used    Labs: Recent Review Advice worker    Labs for ITP Cardiac and Pulmonary Rehab Latest Ref Rng & Units 01/21/2017   PHART 7.350 - 7.450 7.398   PCO2ART 32.0 - 48.0 mmHg 25.4(L)   HCO3 20.0 - 28.0 mmol/L 15.6(L)   TCO2 22 - 32 mmol/L 16(L)   ACIDBASEDEF 0.0 - 2.0 mmol/L 8.0(H)   O2SAT % 96.0      Capillary Blood Glucose: No results found for: GLUCAP   Exercise Target Goals: Date: 08/30/17  Exercise Program Goal: Individual exercise prescription set using results from initial 6 min walk test and THRR while considering  patient's activity barriers and safety.   Exercise Prescription Goal: Initial exercise prescription builds to 30-45 minutes a day of aerobic activity, 2-3 days per week.  Home exercise guidelines will be given to patient during program as part of exercise prescription that the participant will acknowledge.  Activity Barriers & Risk Stratification: Activity  Barriers & Cardiac Risk Stratification - 08/30/17 0926    Activity Barriers & Cardiac Risk Stratification          Activity Barriers  Balance Concerns;Joint Problems;Deconditioning;Muscular Weakness;Shortness of Breath;Arthritis;Other (comment)    Comments  B knee pain, B neuropathy     Cardiac Risk Stratification  High           6 Minute Walk: 6 Minute Walk    6 Minute Walk    Row Name 08/30/17 1137   Phase  Initial   Distance  1319 feet   Walk Time  6 minutes   # of Rest Breaks  0   MPH  2.5   METS  1.95   RPE  9   VO2 Peak  6.8   Symptoms  No    Resting HR  71 bpm   Resting BP  104/60   Resting Oxygen Saturation   96 %   Exercise Oxygen Saturation  during 6 min walk  93 %   Max Ex. HR  92 bpm   Max Ex. BP  122/60   2 Minute Post BP  100/60          Oxygen Initial Assessment:   Oxygen Re-Evaluation:   Oxygen Discharge (Final Oxygen Re-Evaluation):   Initial Exercise Prescription: Initial Exercise Prescription - 08/30/17 1100    Date of Initial Exercise RX and Referring Provider          Date  08/30/17    Referring Provider  Humpreys,Holly MD ([facility]'s coverage)        Treadmill          MPH  2    Grade  0    Minutes  15    METs  2.53        NuStep          Level  2    SPM  75    Minutes  15    METs  2        Arm Ergometer          Level  2    Watts  15    Minutes  15    METs  1.6        Prescription Details          Frequency (times per week)  3    Duration  Progress to 30 minutes of continuous aerobic without signs/symptoms of physical distress        Intensity          THRR 40-80% of Max Heartrate  59-118    Ratings of Perceived Exertion  11-15    Perceived Dyspnea  0-4        Progression          Progression  Continue to progress workloads to maintain intensity without signs/symptoms of physical distress.        Resistance Training          Training Prescription  Yes    Weight  3lbs    Reps  10-15           Perform Capillary Blood Glucose checks as needed.  Exercise Prescription Changes:   Exercise Comments:   Exercise Goals and Review: Exercise Goals    Exercise Goals    Row Name 08/30/17 1610 08/30/17 1132 08/30/17 1134   Increase Physical Activity  Yes  (Pended)   Yes  no documentation   Intervention  Provide advice, education, support and counseling about physical activity/exercise  needs.;Develop an individualized exercise prescription for aerobic and resistive training based on initial evaluation findings, risk stratification, comorbidities and participant's  personal goals.  (Pended)   Provide advice, education, support and counseling about physical activity/exercise needs.;Develop an individualized exercise prescription for aerobic and resistive training based on initial evaluation findings, risk stratification, comorbidities and participant's personal goals.  no documentation   Expected Outcomes  Short Term: Attend rehab on a regular basis to increase amount of physical activity.;Long Term: Exercising regularly at least 3-5 days a week.;Long Term: Add in home exercise to make exercise part of routine and to increase amount of physical activity.  (Pended)   Short Term: Attend rehab on a regular basis to increase amount of physical activity.;Long Term: Exercising regularly at least 3-5 days a week.;Long Term: Add in home exercise to make exercise part of routine and to increase amount of physical activity.  no documentation   Increase Strength and Stamina  Yes  (Pended)  Be able to walk 1.5 miles without difficulty or SOB  Yes  no documentation Be able to walk 1.5 miles without SOB or extreme fatigue/discomfort   Intervention  Provide advice, education, support and counseling about physical activity/exercise needs.;Develop an individualized exercise prescription for aerobic and resistive training based on initial evaluation findings, risk stratification, comorbidities and participant's personal goals.  (Pended)   Provide advice, education, support and counseling about physical activity/exercise needs.;Develop an individualized exercise prescription for aerobic and resistive training based on initial evaluation findings, risk stratification, comorbidities and participant's personal goals.  no documentation   Expected Outcomes  Short Term: Increase workloads from initial exercise prescription for resistance, speed, and METs.;Short Term: Perform resistance training exercises routinely during rehab and add in resistance training at home;Long Term: Improve  cardiorespiratory fitness, muscular endurance and strength as measured by increased METs and functional capacity ( )  (Pended)   Short Term: Increase workloads from initial exercise prescription for resistance, speed, and METs.;Short Term: Perform resistance training exercises routinely during rehab and add in resistance training at home;Long Term: Improve cardiorespiratory fitness, muscular endurance and strength as measured by increased METs and functional capacity ( )  no documentation   Able to understand and use rate of perceived exertion (RPE) scale  Yes  (Pended)   Yes  no documentation   Intervention  Provide education and explanation on how to use RPE scale  (Pended)   Provide education and explanation on how to use RPE scale  no documentation   Expected Outcomes  Short Term: Able to use RPE daily in rehab to express subjective intensity level;Long Term:  Able to use RPE to guide intensity level when exercising independently  (Pended)   Short Term: Able to use RPE daily in rehab to express subjective intensity level;Long Term:  Able to use RPE to guide intensity level when exercising independently  no documentation   Able to understand and use Dyspnea scale  Yes  (Pended)   Yes  no documentation   Intervention  Provide education and explanation on how to use Dyspnea scale  (Pended)   Provide education and explanation on how to use Dyspnea scale  no documentation   Expected Outcomes  Short Term: Able to use Dyspnea scale daily in rehab to express subjective sense of shortness of breath during exertion;Long Term: Able to use Dyspnea scale to guide intensity level when exercising independently  (Pended)   Short Term: Able to use Dyspnea scale daily in rehab to express subjective sense of shortness of breath during exertion;Long  Term: Able to use Dyspnea scale to guide intensity level when exercising independently  no documentation   Knowledge and understanding of Target Heart Rate Range (THRR)   Yes  (Pended)   Yes  no documentation   Intervention  Provide education and explanation of THRR including how the numbers were predicted and where they are located for reference  (Pended)   Provide education and explanation of THRR including how the numbers were predicted and where they are located for reference  no documentation   Expected Outcomes  Long Term: Able to use THRR to govern intensity when exercising independently;Short Term: Able to state/look up THRR;Short Term: Able to use daily as guideline for intensity in rehab  (Pended)   Short Term: Able to state/look up THRR;Long Term: Able to use THRR to govern intensity when exercising independently;Short Term: Able to use daily as guideline for intensity in rehab  no documentation   Able to check pulse independently  Yes  (Pended)   Yes  no documentation   Intervention  Provide education and demonstration on how to check pulse in carotid and radial arteries.;Review the importance of being able to check your own pulse for safety during independent exercise  (Pended)   Provide education and demonstration on how to check pulse in carotid and radial arteries.;Review the importance of being able to check your own pulse for safety during independent exercise  no documentation   Expected Outcomes  Short Term: Able to explain why pulse checking is important during independent exercise;Long Term: Able to check pulse independently and accurately  (Pended)   Short Term: Able to explain why pulse checking is important during independent exercise;Long Term: Able to check pulse independently and accurately  no documentation   Understanding of Exercise Prescription  Yes  (Pended)   Yes  no documentation   Intervention  Provide education, explanation, and written materials on patient's individual exercise prescription  (Pended)   Provide education, explanation, and written materials on patient's individual exercise prescription  no documentation   Expected Outcomes   Short Term: Able to explain program exercise prescription;Long Term: Able to explain home exercise prescription to exercise independently  (Pended)   Short Term: Able to explain program exercise prescription;Long Term: Able to explain home exercise prescription to exercise independently  no documentation          Exercise Goals Re-Evaluation :    Discharge Exercise Prescription (Final Exercise Prescription Changes):   Nutrition:  Target Goals: Understanding of nutrition guidelines, daily intake of sodium 1500mg , cholesterol 200mg , calories 30% from fat and 7% or less from saturated fats, daily to have 5 or more servings of fruits and vegetables.  Biometrics: Pre Biometrics - 08/30/17 1131    Pre Biometrics          Height   (1.778 m)    Weight  278 lb 10.6 oz (126.4 kg)    Waist Circumference  52 inches    Hip Circumference  51.5 inches    Waist to Hip Ratio  1.01 %    BMI (Calculated)  39.98    Triceps Skinfold  30 mm    % Body Fat  40.6 %    Grip Strength  32 kg    Flexibility  8 in    Single Leg Stand  3 seconds            Nutrition Therapy Plan and Nutrition Goals:   Nutrition Assessments:   Nutrition Goals Re-Evaluation:   Nutrition Goals Re-Evaluation:  Nutrition Goals Discharge (Final Nutrition Goals Re-Evaluation):   Psychosocial: Target Goals: Acknowledge presence or absence of significant depression and/or stress, maximize coping skills, provide positive support system. Participant is able to verbalize types and ability to use techniques and skills needed for reducing stress and depression.  Initial Review & Psychosocial Screening: Initial Psych Review & Screening - 08/31/17 0744    Initial Review          Current issues with  None Identified        Family Dynamics          Good Support System?  Yes        Barriers          Psychosocial barriers to participate in program  There are no identifiable barriers or psychosocial needs.         Screening Interventions          Interventions  Encouraged to exercise           Quality of Life Scores:  Scores of 19 and below usually indicate a poorer quality of life in these areas.  A difference of  2-3 points is a clinically meaningful difference.  A difference of 2-3 points in the total score of the Quality of Life Index has been associated with significant improvement in overall quality of life, self-image, physical symptoms, and general health in studies assessing change in quality of life.  PHQ-9: Recent Review Flowsheet Data    There is no flowsheet data to display.     Interpretation of Total Score  Total Score Depression Severity:  1-4 = Minimal depression, 5-9 = Mild depression, 10-14 = Moderate depression, 15-19 = Moderately severe depression, 20-27 = Severe depression   Psychosocial Evaluation and Intervention:   Psychosocial Re-Evaluation:   Psychosocial Discharge (Final Psychosocial Re-Evaluation):   Vocational Rehabilitation: Provide vocational rehab assistance to qualifying candidates.   Vocational Rehab Evaluation & Intervention:   Education: Education Goals: Education classes will be provided on a weekly basis, covering required topics. Participant will state understanding/return demonstration of topics presented.  Learning Barriers/Preferences: Learning Barriers/Preferences - 08/30/17 0926    Learning Barriers/Preferences          Learning Barriers  Hearing;Sight    Learning Preferences  Skilled Demonstration;Written Material           Education Topics: Count Your Pulse:  -Group instruction provided by verbal instruction, demonstration, patient participation and written materials to support subject.  Instructors address importance of being able to find your pulse and how to count your pulse when at home without a heart monitor.  Patients get hands on experience counting their pulse with staff help and individually.   Heart  Attack, Angina, and Risk Factor Modification:  -Group instruction provided by verbal instruction, video, and written materials to support subject.  Instructors address signs and symptoms of angina and heart attacks.    Also discuss risk factors for heart disease and how to make changes to improve heart health risk factors.   Functional Fitness:  -Group instruction provided by verbal instruction, demonstration, patient participation, and written materials to support subject.  Instructors address safety measures for doing things around the house.  Discuss how to get up and down off the floor, how to pick things up properly, how to safely get out of a chair without assistance, and balance training.   Meditation and Mindfulness:  -Group instruction provided by verbal instruction, patient participation, and written materials to support subject.  Instructor addresses importance of  mindfulness and meditation practice to help reduce stress and improve awareness.  Instructor also leads participants through a meditation exercise.    Stretching for Flexibility and Mobility:  -Group instruction provided by verbal instruction, patient participation, and written materials to support subject.  Instructors lead participants through series of stretches that are designed to increase flexibility thus improving mobility.  These stretches are additional exercise for major muscle groups that are typically performed during regular warm up and cool down.   Hands Only CPR:  -Group verbal, video, and participation provides a basic overview of AHA guidelines for community CPR. Role-play of emergencies allow participants the opportunity to practice calling for help and chest compression technique with discussion of AED use.   Hypertension: -Group verbal and written instruction that provides a basic overview of hypertension including the most recent diagnostic guidelines, risk factor reduction with self-care instructions  and medication management.    Nutrition I class: Heart Healthy Eating:  -Group instruction provided by PowerPoint slides, verbal discussion, and written materials to support subject matter. The instructor gives an explanation and review of the Therapeutic Lifestyle Changes diet recommendations, which includes a discussion on lipid goals, dietary fat, sodium, fiber, plant stanol/sterol esters, sugar, and the components of a well-balanced, healthy diet.   Nutrition II class: Lifestyle Skills:  -Group instruction provided by PowerPoint slides, verbal discussion, and written materials to support subject matter. The instructor gives an explanation and review of label reading, grocery shopping for heart health, heart healthy recipe modifications, and ways to make healthier choices when eating out.   Diabetes Question & Answer:  -Group instruction provided by PowerPoint slides, verbal discussion, and written materials to support subject matter. The instructor gives an explanation and review of diabetes co-morbidities, pre- and post-prandial blood glucose goals, pre-exercise blood glucose goals, signs, symptoms, and treatment of hypoglycemia and hyperglycemia, and foot care basics.   Diabetes Blitz:  -Group instruction provided by PowerPoint slides, verbal discussion, and written materials to support subject matter. The instructor gives an explanation and review of the physiology behind type 1 and type 2 diabetes, diabetes medications and rational behind using different medications, pre- and post-prandial blood glucose recommendations and Hemoglobin A1c goals, diabetes diet, and exercise including blood glucose guidelines for exercising safely.    Portion Distortion:  -Group instruction provided by PowerPoint slides, verbal discussion, written materials, and food models to support subject matter. The instructor gives an explanation of serving size versus portion size, changes in portions sizes over the  last 20 years, and what consists of a serving from each food group.   Stress Management:  -Group instruction provided by verbal instruction, video, and written materials to support subject matter.  Instructors review role of stress in heart disease and how to cope with stress positively.     Exercising on Your Own:  -Group instruction provided by verbal instruction, power point, and written materials to support subject.  Instructors discuss benefits of exercise, components of exercise, frequency and intensity of exercise, and end points for exercise.  Also discuss use of nitroglycerin and activating EMS.  Review options of places to exercise outside of rehab.  Review guidelines for sex with heart disease.   Cardiac Drugs I:  -Group instruction provided by verbal instruction and written materials to support subject.  Instructor reviews cardiac drug classes: antiplatelets, anticoagulants, beta blockers, and statins.  Instructor discusses reasons, side effects, and lifestyle considerations for each drug class.   Cardiac Drugs II:  -Group instruction provided by verbal instruction  and written materials to support subject.  Instructor reviews cardiac drug classes: angiotensin converting enzyme inhibitors (ACE-I), angiotensin II receptor blockers (ARBs), nitrates, and calcium channel blockers.  Instructor discusses reasons, side effects, and lifestyle considerations for each drug class.   Anatomy and Physiology of the Circulatory System:  Group verbal and written instruction and models provide basic cardiac anatomy and physiology, with the coronary electrical and arterial systems. Review of: AMI, Angina, Valve disease, Heart Failure, Peripheral Artery Disease, Cardiac Arrhythmia, Pacemakers, and the ICD.   Other Education:  -Group or individual verbal, written, or video instructions that support the educational goals of the cardiac rehab program.   Holiday Eating Survival Tips:  -Group  instruction provided by PowerPoint slides, verbal discussion, and written materials to support subject matter. The instructor gives patients tips, tricks, and techniques to help them not only survive but enjoy the holidays despite the onslaught of food that accompanies the holidays.   Knowledge Questionnaire Score:   Core Components/Risk Factors/Patient Goals at Admission: Personal Goals and Risk Factors at Admission - 08/30/17 1129    Core Components/Risk Factors/Patient Goals on Admission           Weight Management  Yes;Obesity    Intervention  Weight Management: Develop a combined nutrition and exercise program designed to reach desired caloric intake, while maintaining appropriate intake of nutrient and fiber, sodium and fats, and appropriate energy expenditure required for the weight goal.;Weight Management/Obesity: Establish reasonable short term and long term weight goals.;Weight Management: Provide education and appropriate resources to help participant work on and attain dietary goals.;Obesity: Provide education and appropriate resources to help participant work on and attain dietary goals.    Admit Weight  278 lb 10.6 oz (126.4 kg)    Goal Weight: Short Term  268 lb (121.6 kg)    Goal Weight: Long Term  235 lb (106.6 kg)    Expected Outcomes  Short Term: Continue to assess and modify interventions until short term weight is achieved;Long Term: Adherence to nutrition and physical activity/exercise program aimed toward attainment of established weight goal;Weight Loss: Understanding of general recommendations for a balanced deficit meal plan, which promotes 1-2 lb weight loss per week and includes a negative energy balance of [id number] kcal/d;Weight Maintenance: Understanding of the daily nutrition guidelines, which includes 25-35% calories from fat, 7% or less cal from saturated fats, less than  cholesterol, less than 1.5gm of sodium, & 5 or more servings of fruits and vegetables  daily;Understanding recommendations for meals to include 15-35% energy as protein, 25-35% energy from fat, 35-60% energy from carbohydrates, less than  of dietary cholesterol, 20-35 gm of total fiber daily;Understanding of distribution of calorie intake throughout the day with the consumption of 4-5 meals/snacks    Diabetes  Yes    Intervention  Provide education about signs/symptoms and action to take for hypo/hyperglycemia.;Provide education about proper nutrition, including hydration, and aerobic/resistive exercise prescription along with prescribed medications to achieve blood glucose in normal ranges: Fasting glucose 65-99 mg/dL    Expected Outcomes  Short Term: Participant verbalizes understanding of the signs/symptoms and immediate care of hyper/hypoglycemia, proper foot care and importance of medication, aerobic/resistive exercise and nutrition plan for blood glucose control.;Long Term: Attainment of HbA1C < 7%.    Hypertension  Yes    Intervention  Provide education on lifestyle modifcations including regular physical activity/exercise, weight management, moderate sodium restriction and increased consumption of fresh fruit, vegetables, and low fat dairy, alcohol moderation, and smoking cessation.;Monitor prescription use compliance.  Expected Outcomes  Short Term: Continued assessment and intervention until BP is < 140/73mm HG in hypertensive participants. < 130/89mm HG in hypertensive participants with diabetes, heart failure or chronic kidney disease.;Long Term: Maintenance of blood pressure at goal levels.    Lipids  Yes    Intervention  Provide education and support for participant on nutrition & aerobic/resistive exercise along with prescribed medications to achieve LDL 70mg , HDL >40mg .    Expected Outcomes  Short Term: Participant states understanding of desired cholesterol values and is compliant with medications prescribed. Participant is following exercise prescription and  nutrition guidelines.;Long Term: Cholesterol controlled with medications as prescribed, with individualized exercise RX and with personalized nutrition plan. Value goals: LDL < , HDL > 40 mg.           Core Components/Risk Factors/Patient Goals Review:    Core Components/Risk Factors/Patient Goals at Discharge (Final Review):    ITP Comments: ITP Comments    Row Name 08/30/17 0921   ITP Comments  Dr. Armanda Magic, Medical Director      Comments: Patient attended orientation from [phone]  to review rules and guidelines for program. Completed 6 minute walk test, Intitial ITP, and exercise prescription.  VSS. Telemetry-sinus rhythm  Asymptomatic.

## 2017-09-07 ENCOUNTER — Encounter (HOSPITAL_COMMUNITY): Payer: Medicare PPO

## 2017-09-07 ENCOUNTER — Encounter (HOSPITAL_COMMUNITY)
Admission: RE | Admit: 2017-09-07 | Discharge: 2017-09-07 | Disposition: A | Discharge status: Home / Self Care | Payer: Medicare PPO | Source: Ambulatory Visit | Attending: Cardiology | Admitting: Cardiology

## 2017-09-07 DIAGNOSIS — Z951 Presence of aortocoronary bypass graft: Secondary | ICD-10-CM | POA: Diagnosis not present

## 2017-09-07 DIAGNOSIS — E119 Type 2 diabetes mellitus without complications: Secondary | ICD-10-CM | POA: Diagnosis not present

## 2017-09-07 LAB — GLUCOSE, CAPILLARY
Glucose-Capillary: 109 mg/dL — ABNORMAL HIGH (ref 65–99)
Glucose-Capillary: 111 mg/dL — ABNORMAL HIGH (ref 65–99)

## 2017-09-07 NOTE — Progress Notes (Addendum)
Daily Session Note  Patient Details  Name: Allen Alexander MRN: [id number] Date of Birth: 12/15/1945 Referring Provider:     [place] [place] from 08/30/2017 in [place]  Referring Provider  Humpreys,Holly MD ([facility]'s coverage)      Encounter Date: 09/07/2017  Check In: Session Check In - 09/07/17 1354      Check-In   Location  [facility]    Staff Present  Barnet Pall, RN, BSN;Amber Fair, MS, ACSM RCEP, Exercise Physiologist;Olinty Celesta Aver, MS, ACSM CEP, Exercise Physiologist;Tyara Carol Ada, MS,ACSM CEP, Exercise Physiologist    Supervising physician immediately available to respond to emergencies  [facility] immediately available    Physician(s)  Dr. Maylene Roes    Medication changes reported      No    Fall or balance concerns reported     No    Tobacco Cessation  No Change    Warm-up and Cool-down  Performed as group-led instruction    Resistance Training Performed  No    VAD Patient?  No      Pain Assessment   Currently in Pain?  No/denies    Multiple Pain Sites  No       Capillary Blood Glucose: Results for orders placed or performed during the hospital encounter of 09/07/17 (from the past 24 hour(s))  Glucose, capillary     Status: Abnormal   Collection Time: 09/07/17  1:29 PM  Result Value Ref Range   Glucose-Capillary 109 (H) 65 - 99 mg/dL  Glucose, capillary     Status: Abnormal   Collection Time: 09/07/17  1:59 PM  Result Value Ref Range   Glucose-Capillary 111 (H) 65 - 99 mg/dL      Social History   Tobacco Use  Smoking Status Never Smoker  Smokeless Tobacco Never Used    Goals Met:  Exercise tolerated well  Goals Unmet:  Not Applicable  Comments: Chet started cardiac rehab today.  Pt tolerated light exercise without difficulty. VSS, telemetry-Sinus Rhythm BBB, asymptomatic.  Medication list reconciled. Pt denies barriers to medicaiton compliance.  PSYCHOSOCIAL ASSESSMENT:   PHQ-0. Pt exhibits positive coping skills, hopeful outlook with supportive family. No psychosocial needs identified at this time, no psychosocial interventions necessary.    Pt enjoys spending time with grandchildren and reading.   Pt oriented to exercise equipment and routine.    Understanding verbalized. Chet left the session early to pick up his grandson for tutoring.Barnet Pall, RN,BSN 09/07/2017 2:48 PM   Dr. Fransico Him is Medical Director for Cardiac Rehab at [facility].

## 2017-09-09 ENCOUNTER — Encounter (HOSPITAL_COMMUNITY): Payer: Medicare PPO

## 2017-09-09 ENCOUNTER — Encounter (HOSPITAL_COMMUNITY)
Admission: RE | Admit: 2017-09-09 | Discharge: 2017-09-09 | Disposition: A | Discharge status: Home / Self Care | Payer: Medicare PPO | Source: Ambulatory Visit | Attending: Cardiology | Admitting: Cardiology

## 2017-09-09 DIAGNOSIS — Z951 Presence of aortocoronary bypass graft: Secondary | ICD-10-CM

## 2017-09-09 LAB — GLUCOSE, CAPILLARY
GLUCOSE-CAPILLARY: 264 mg/dL — AB (ref 65–99)
GLUCOSE-CAPILLARY: 182 mg/dL — AB (ref 65–99)

## 2017-09-12 ENCOUNTER — Encounter (HOSPITAL_COMMUNITY): Payer: Medicare PPO

## 2017-09-12 ENCOUNTER — Encounter (HOSPITAL_COMMUNITY)
Admission: RE | Admit: 2017-09-12 | Discharge: 2017-09-12 | Disposition: A | Discharge status: Home / Self Care | Payer: Medicare PPO | Source: Ambulatory Visit | Attending: Cardiology | Admitting: Cardiology

## 2017-09-12 DIAGNOSIS — Z951 Presence of aortocoronary bypass graft: Secondary | ICD-10-CM | POA: Diagnosis present

## 2017-09-12 DIAGNOSIS — E119 Type 2 diabetes mellitus without complications: Secondary | ICD-10-CM | POA: Insufficient documentation

## 2017-09-12 LAB — GLUCOSE, CAPILLARY: GLUCOSE-CAPILLARY: 188 mg/dL — AB (ref 65–99)

## 2017-09-14 ENCOUNTER — Encounter (HOSPITAL_COMMUNITY)
Admission: RE | Admit: 2017-09-14 | Discharge: 2017-09-14 | Disposition: A | Discharge status: Home / Self Care | Payer: Medicare PPO | Source: Ambulatory Visit | Attending: Cardiology | Admitting: Cardiology

## 2017-09-14 ENCOUNTER — Encounter (HOSPITAL_COMMUNITY): Payer: Medicare PPO

## 2017-09-14 DIAGNOSIS — Z951 Presence of aortocoronary bypass graft: Secondary | ICD-10-CM

## 2017-09-14 LAB — GLUCOSE, CAPILLARY: GLUCOSE-CAPILLARY: 216 mg/dL — AB (ref 65–99)

## 2017-09-14 NOTE — Progress Notes (Signed)
Allen Alexander 73 y.o. male DOB: Jun 18, 1945 MRN: [id number]      Nutrition Note  Dx: CABG x 4 Labs:  CBG (last 3)  Recent Labs    09/12/17 1330  GLUCAP 188*   Nutrition Note Spoke with pt. Nutrition plan and survey reviewed with pt. Pt is following a Heart Healthy diet. Pt wants to lose wt. Per discussion, barriers to wt loss include mindless eating and portion sizes. Wt loss tips reviewed. Pt is diabetic. Diabetes previously discussed. Pt expressed understanding of the information reviewed. Pt aware of nutrition education classes offered and plans on attending nutrition classes.  Nutrition Diagnosis ? Food-and nutrition-related knowledge deficit related to lack of exposure to information as related to diagnosis of: ? CVD ? DM  ? Obesity related to excessive energy intake as evidenced by a BMI of 39.98  Nutrition Intervention ? Pt encouraged to drink water when he thinks he is hungry. ? Pt's individual nutrition plan reviewed with pt. ? Benefits of adopting Heart Healthy diet discussed when Medficts reviewed.    Nutrition Goal(s):  ? Pt to identify and limit food sources of saturated fat, trans fat, and sodium ? Pt to identify food quantities necessary to achieve weight loss of 6-24 lb (2.7-10.9 kg) at graduation from cardiac rehab. Goal wt of 235 lb desired.   Plan:  Pt to attend nutrition classes ? Nutrition I - met 09/13/17 ? Nutrition II ? Portion Distortion  Will provide client-centered nutrition education as part of interdisciplinary care.   Monitor and evaluate progress toward nutrition goal with team.  Derek Mound, M.Ed, RD, LDN, CDE 09/14/2017 1:57 PM

## 2017-09-16 ENCOUNTER — Encounter (HOSPITAL_COMMUNITY): Payer: Medicare PPO

## 2017-09-16 ENCOUNTER — Encounter (HOSPITAL_COMMUNITY)
Admission: RE | Admit: 2017-09-16 | Discharge: 2017-09-16 | Disposition: A | Discharge status: Home / Self Care | Payer: Medicare PPO | Source: Ambulatory Visit | Attending: Cardiology | Admitting: Cardiology

## 2017-09-16 ENCOUNTER — Encounter (HOSPITAL_COMMUNITY): Payer: Self-pay

## 2017-09-16 DIAGNOSIS — Z951 Presence of aortocoronary bypass graft: Secondary | ICD-10-CM

## 2017-09-16 LAB — GLUCOSE, CAPILLARY: GLUCOSE-CAPILLARY: 147 mg/dL — AB (ref 65–99)

## 2017-09-16 NOTE — Progress Notes (Signed)
Reviewed home exercise with pt today. [facility] in addition to coming to cardiac rehab. Reviewed THR, pulse, RPE, sign and symptoms, and when to call 911 or MD.  Also discussed weather considerations and indoor options.  Pt voiced understanding.    Braxson Hollingsworth [employer] PartsFair,MS,ACSM RCEP

## 2017-09-19 ENCOUNTER — Encounter (HOSPITAL_COMMUNITY): Payer: Medicare PPO

## 2017-09-19 ENCOUNTER — Encounter (HOSPITAL_COMMUNITY)
Admission: RE | Admit: 2017-09-19 | Discharge: 2017-09-19 | Disposition: A | Discharge status: Home / Self Care | Payer: Medicare PPO | Source: Ambulatory Visit | Attending: Cardiology | Admitting: Cardiology

## 2017-09-19 DIAGNOSIS — Z951 Presence of aortocoronary bypass graft: Secondary | ICD-10-CM | POA: Diagnosis not present

## 2017-09-19 LAB — GLUCOSE, CAPILLARY: GLUCOSE-CAPILLARY: 161 mg/dL — AB (ref 65–99)

## 2017-09-21 ENCOUNTER — Encounter (HOSPITAL_COMMUNITY)
Admission: RE | Admit: 2017-09-21 | Discharge: 2017-09-21 | Disposition: A | Discharge status: Home / Self Care | Payer: Medicare PPO | Source: Ambulatory Visit | Attending: Cardiology | Admitting: Cardiology

## 2017-09-21 ENCOUNTER — Encounter (HOSPITAL_COMMUNITY): Payer: Medicare PPO

## 2017-09-21 DIAGNOSIS — Z951 Presence of aortocoronary bypass graft: Secondary | ICD-10-CM | POA: Diagnosis not present

## 2017-09-21 LAB — GLUCOSE, CAPILLARY: Glucose-Capillary: 146 mg/dL — ABNORMAL HIGH (ref 65–99)

## 2017-09-22 NOTE — Progress Notes (Signed)
Cardiac Individual Treatment Plan  Patient Details  Name: Allen Alexander MRN: [id number] Date of Birth: [birth year] Referring Provider:   Flowsheet Row CARDIAC REHAB PHASE II ORIENTATION from 08/30/2017 in [facility] CARDIAC [facility]  Referring Provider  Humpreys,Holly MD ([facility]er's coverage)      Initial Encounter Date:  Flowsheet Row CARDIAC REHAB PHASE II ORIENTATION from 08/30/2017 in [facility] CARDIAC REHAB  Date  08/30/17  Referring Provider  Humpreys,Holly MD ([facility]er's coverage)      Visit Diagnosis: S/P CABG x 4  Patient's Home Medications on Admission:  Current Outpatient Medications:  .  aspirin 81 MG chewable tablet, Chew 162 mg by mouth daily., Disp: , Rfl:  .  atorvastatin (LIPITOR) 40 MG tablet, Take 40 mg by mouth daily., Disp: , Rfl:  .  Cholecalciferol (VITAMIN D) 2000 units CAPS, Take 2,000 Units by mouth every morning., Disp: , Rfl:  .  diphenhydrAMINE (BENADRYL) 25 MG tablet, Take 25 mg by mouth every 6 (six) hours as needed., Disp: , Rfl:  .  furosemide (LASIX) 20 MG tablet, Take 20 mg by mouth daily. For 7 days; started on 01-21-17, Disp: , Rfl:  .  insulin aspart (NOVOLOG) 100 UNIT/ML injection, Inject 2-10 Units into the skin 3 (three) times daily before meals. Sliding scale. 151-200=2 units. 201-250=4 units.  251-300= 6 units.  351-400=10 units. Greater than 400; call MD, Disp: , Rfl:  .  insulin glargine (LANTUS) 100 UNIT/ML injection, Inject 25 Units into the skin at bedtime. , Disp: , Rfl:  .  isosorbide mononitrate (IMDUR) 60 MG 24 hr tablet, Take 60 mg by mouth daily., Disp: , Rfl:  .  Melatonin 10 MG TABS, Take 1 tablet by mouth at bedtime as needed., Disp: , Rfl:  .  metFORMIN (GLUCOPHAGE-XR) 500 MG 24 hr tablet, Take 2,000 mg by mouth daily with breakfast., Disp: , Rfl:  .  metoprolol tartrate (LOPRESSOR) 25 MG tablet, Take 12.5 mg by mouth 2 (two) times daily. , Disp: , Rfl:  .  Multiple Vitamins-Minerals  (MULTIVITAMIN WITH MINERALS) tablet, Take 1 tablet by mouth daily., Disp: , Rfl:  .  niacin 100 MG tablet, Take 100 mg by mouth at bedtime., Disp: , Rfl:  .  potassium chloride (K-DUR,KLOR-CON) 10 MEQ tablet, Take by mouth., Disp: , Rfl:   Past Medical History: Past Medical History:  Diagnosis Date  . Diabetes mellitus without complication (HCC)     Tobacco Use: Social History   Tobacco Use  Smoking Status Never Smoker  Smokeless Tobacco Never Used    Labs: Recent Review Advice worker    Labs for ITP Cardiac and Pulmonary Rehab Latest Ref Rng & Units 01/21/2017   PHART 7.350 - 7.450 7.398   PCO2ART 32.0 - 48.0 mmHg 25.4(L)   HCO3 20.0 - 28.0 mmol/L 15.6(L)   TCO2 22 - 32 mmol/L 16(L)   ACIDBASEDEF 0.0 - 2.0 mmol/L 8.0(H)   O2SAT % 96.0      Capillary Blood Glucose: Lab Results  Component Value Date   GLUCAP 146 (H) 09/21/2017   GLUCAP 161 (H) 09/19/2017   GLUCAP 147 (H) 09/16/2017   GLUCAP 216 (H) 09/14/2017   GLUCAP 188 (H) 09/12/2017     Exercise Target Goals:    Exercise Program Goal: Individual exercise prescription set using results from initial 6 min walk test and THRR while considering  patient's activity barriers and safety.   Exercise Prescription Goal: Initial exercise prescription builds to 30-45 minutes a day of aerobic  activity, 2-3 days per week.  Home exercise guidelines will be given to patient during program as part of exercise prescription that the participant will acknowledge.  Activity Barriers & Risk Stratification: Activity Barriers & Cardiac Risk Stratification - 08/30/17 0926    Activity Barriers & Cardiac Risk Stratification          Activity Barriers  Balance Concerns;Joint Problems;Deconditioning;Muscular Weakness;Shortness of Breath;Arthritis;Other (comment)    Comments  B knee pain, B neuropathy     Cardiac Risk Stratification  High           6 Minute Walk: 6 Minute Walk    6 Minute Walk    Row Name 08/30/17 1137   Phase   Initial   Distance  1319 feet   Walk Time  6 minutes   # of Rest Breaks  0   MPH  2.5   METS  1.95   RPE  9   VO2 Peak  6.8   Symptoms  No   Resting HR  71 bpm   Resting BP  104/60   Resting Oxygen Saturation   96 %   Exercise Oxygen Saturation  during 6 min walk  93 %   Max Ex. HR  92 bpm   Max Ex. BP  122/60   2 Minute Post BP  100/60          Oxygen Initial Assessment:   Oxygen Re-Evaluation:   Oxygen Discharge (Final Oxygen Re-Evaluation):   Initial Exercise Prescription: Initial Exercise Prescription - 08/30/17 1100    Date of Initial Exercise RX and Referring Provider          Date  08/30/17    Referring Provider  Humpreys,Holly MD ([facility]'s coverage)        Treadmill          MPH  2    Grade  0    Minutes  15    METs  2.53        NuStep          Level  2    SPM  75    Minutes  15    METs  2        Arm Ergometer          Level  2    Watts  15    Minutes  15    METs  1.6        Prescription Details          Frequency (times per week)  3    Duration  Progress to 30 minutes of continuous aerobic without signs/symptoms of physical distress        Intensity          THRR 40-80% of Max Heartrate  59-118    Ratings of Perceived Exertion  11-15    Perceived Dyspnea  0-4        Progression          Progression  Continue to progress workloads to maintain intensity without signs/symptoms of physical distress.        Resistance Training          Training Prescription  Yes    Weight  3lbs    Reps  10-15           Perform Capillary Blood Glucose checks as needed.  Exercise Prescription Changes: Exercise Prescription Changes    Response to Exercise    Row Name 09/07/17 1203 09/12/17 1201   Blood Pressure (Admit)  110/70  120/60   Blood Pressure (Exercise)  146/70  128/70   Blood Pressure (Exit)  124/60  120/70   Heart Rate (Admit)  82 bpm  59 bpm   Heart Rate (Exercise)  127 bpm  107 bpm   Heart Rate (Exit)  82 bpm  62 bpm    Rating of Perceived Exertion (Exercise)  11  11   Symptoms  none  none   Comments  pt oriented to exercise equipment  no documentation   Duration  Continue with 30 min of aerobic exercise without signs/symptoms of physical distress.  Continue with 30 min of aerobic exercise without signs/symptoms of physical distress.   Intensity  THRR unchanged  THRR unchanged       Progression    Row Name 09/07/17 1203 09/12/17 1201   Progression  Continue to progress workloads to maintain intensity without signs/symptoms of physical distress.  no documentation       Resistance Training    Row Name 09/07/17 1203 09/12/17 1201   Training Prescription  No pt had to leave early  Yes   Weight  no documentation  3lbs   Reps  no documentation  10-15   Time  no documentation  10 Minutes       Treadmill    Row Name 09/07/17 1203 09/12/17 1201   MPH  2.2  2.5   Grade  0  0   Minutes  15  15   METs  2.69  2.91       NuStep    Row Name 09/07/17 1203 09/12/17 1201   Level  2  3   SPM  75  80   Minutes  15  15   METs  no documentation  2.8       Arm Ergometer    Row Name 09/07/17 1203 09/12/17 1201   Level  2  no documentation   Watts  15  no documentation   Minutes  15  no documentation   METs  1.6  no documentation          Exercise Comments: Exercise Comments    Row Name 09/20/17 1517   Exercise Comments  Reviewed METs and goals. Pt is tolerating light exercise in cardiac rehab; will continue to monitor activity levels.       Exercise Goals and Review: Exercise Goals    Exercise Goals    Row Name 08/30/17 6962 08/30/17 1132 08/30/17 1134   Increase Physical Activity  Yes  (Pended)   Yes  no documentation   Intervention  Provide advice, education, support and counseling about physical activity/exercise needs.;Develop an individualized exercise prescription for aerobic and resistive training based on initial evaluation findings, risk stratification, comorbidities and participant's  personal goals.  (Pended)   Provide advice, education, support and counseling about physical activity/exercise needs.;Develop an individualized exercise prescription for aerobic and resistive training based on initial evaluation findings, risk stratification, comorbidities and participant's personal goals.  no documentation   Expected Outcomes  Short Term: Attend rehab on a regular basis to increase amount of physical activity.;Long Term: Exercising regularly at least 3-5 days a week.;Long Term: Add in home exercise to make exercise part of routine and to increase amount of physical activity.  (Pended)   Short Term: Attend rehab on a regular basis to increase amount of physical activity.;Long Term: Exercising regularly at least 3-5 days a week.;Long Term: Add in home exercise to make exercise part of routine and to increase amount of  physical activity.  no documentation   Increase Strength and Stamina  Yes  (Pended)  Be able to walk 1.5 miles without difficulty or SOB  Yes  no documentation Be able to walk 1.5 miles without SOB or extreme fatigue/discomfort   Intervention  Provide advice, education, support and counseling about physical activity/exercise needs.;Develop an individualized exercise prescription for aerobic and resistive training based on initial evaluation findings, risk stratification, comorbidities and participant's personal goals.  (Pended)   Provide advice, education, support and counseling about physical activity/exercise needs.;Develop an individualized exercise prescription for aerobic and resistive training based on initial evaluation findings, risk stratification, comorbidities and participant's personal goals.  no documentation   Expected Outcomes  Short Term: Increase workloads from initial exercise prescription for resistance, speed, and METs.;Short Term: Perform resistance training exercises routinely during rehab and add in resistance training at home;Long Term: Improve  cardiorespiratory fitness, muscular endurance and strength as measured by increased METs and functional capacity ( )  (Pended)   Short Term: Increase workloads from initial exercise prescription for resistance, speed, and METs.;Short Term: Perform resistance training exercises routinely during rehab and add in resistance training at home;Long Term: Improve cardiorespiratory fitness, muscular endurance and strength as measured by increased METs and functional capacity ( )  no documentation   Able to understand and use rate of perceived exertion (RPE) scale  Yes  (Pended)   Yes  no documentation   Intervention  Provide education and explanation on how to use RPE scale  (Pended)   Provide education and explanation on how to use RPE scale  no documentation   Expected Outcomes  Short Term: Able to use RPE daily in rehab to express subjective intensity level;Long Term:  Able to use RPE to guide intensity level when exercising independently  (Pended)   Short Term: Able to use RPE daily in rehab to express subjective intensity level;Long Term:  Able to use RPE to guide intensity level when exercising independently  no documentation   Able to understand and use Dyspnea scale  Yes  (Pended)   Yes  no documentation   Intervention  Provide education and explanation on how to use Dyspnea scale  (Pended)   Provide education and explanation on how to use Dyspnea scale  no documentation   Expected Outcomes  Short Term: Able to use Dyspnea scale daily in rehab to express subjective sense of shortness of breath during exertion;Long Term: Able to use Dyspnea scale to guide intensity level when exercising independently  (Pended)   Short Term: Able to use Dyspnea scale daily in rehab to express subjective sense of shortness of breath during exertion;Long Term: Able to use Dyspnea scale to guide intensity level when exercising independently  no documentation   Knowledge and understanding of Target Heart Rate Range (THRR)   Yes  (Pended)   Yes  no documentation   Intervention  Provide education and explanation of THRR including how the numbers were predicted and where they are located for reference  (Pended)   Provide education and explanation of THRR including how the numbers were predicted and where they are located for reference  no documentation   Expected Outcomes  Long Term: Able to use THRR to govern intensity when exercising independently;Short Term: Able to state/look up THRR;Short Term: Able to use daily as guideline for intensity in rehab  (Pended)   Short Term: Able to state/look up THRR;Long Term: Able to use THRR to govern intensity when exercising independently;Short Term: Able to use daily as guideline  for intensity in rehab  no documentation   Able to check pulse independently  Yes  (Pended)   Yes  no documentation   Intervention  Provide education and demonstration on how to check pulse in carotid and radial arteries.;Review the importance of being able to check your own pulse for safety during independent exercise  (Pended)   Provide education and demonstration on how to check pulse in carotid and radial arteries.;Review the importance of being able to check your own pulse for safety during independent exercise  no documentation   Expected Outcomes  Short Term: Able to explain why pulse checking is important during independent exercise;Long Term: Able to check pulse independently and accurately  (Pended)   Short Term: Able to explain why pulse checking is important during independent exercise;Long Term: Able to check pulse independently and accurately  no documentation   Understanding of Exercise Prescription  Yes  (Pended)   Yes  no documentation   Intervention  Provide education, explanation, and written materials on patient's individual exercise prescription  (Pended)   Provide education, explanation, and written materials on patient's individual exercise prescription  no documentation   Expected Outcomes   Short Term: Able to explain program exercise prescription;Long Term: Able to explain home exercise prescription to exercise independently  (Pended)   Short Term: Able to explain program exercise prescription;Long Term: Able to explain home exercise prescription to exercise independently  no documentation          Exercise Goals Re-Evaluation : Exercise Goals Re-Evaluation    Exercise Goal Re-Evaluation    Row Name 09/16/17 1443 09/20/17 1518   Exercise Goals Review  Increase Physical Activity;Able to understand and use rate of perceived exertion (RPE) scale;Knowledge and understanding of Target Heart Rate Range (THRR);Understanding of Exercise Prescription;Increase Strength and Stamina;Able to check pulse independently  Increase Physical Activity;Able to understand and use rate of perceived exertion (RPE) scale;Knowledge and understanding of Target Heart Rate Range (THRR);Understanding of Exercise Prescription;Increase Strength and Stamina;Able to check pulse independently   Comments  Reviewed home exercise with pt today. Pt plans to go to the [facility] for exercise, 2x/week in addition to coming to cardiac rehab. Reviewed THR, pulse, RPE, sign and symptoms, and when to call 911 or MD.  Also discussed weather considerations and indoor options.  Pt voiced understanding.  Reviewed home exercise with pt in which he plans to go to the [facility] for exercise, 2x/week in addition to coming to cardiac rehab.    Expected Outcomes  Pt will improve in cardirespiratory fitness and functional capacity.   Pt will improve in cardirespiratory fitness and functional capacity.            Discharge Exercise Prescription (Final Exercise Prescription Changes): Exercise Prescription Changes - 09/12/17 1201    Response to Exercise          Blood Pressure (Admit)  120/60    Blood Pressure (Exercise)  128/70    Blood Pressure (Exit)  120/70    Heart Rate (Admit)  59 bpm    Heart Rate (Exercise)  107 bpm    Heart Rate  (Exit)  62 bpm    Rating of Perceived Exertion (Exercise)  11    Symptoms  none    Duration  Continue with 30 min of aerobic exercise without signs/symptoms of physical distress.    Intensity  THRR unchanged        Resistance Training          Training Prescription  Yes  Weight  3lbs    Reps  10-15    Time  10 Minutes        Treadmill          MPH  2.5    Grade  0    Minutes  15    METs  2.91        NuStep          Level  3    SPM  80    Minutes  15    METs  2.8           Nutrition:  Target Goals: Understanding of nutrition guidelines, daily intake of sodium 1500mg , cholesterol 200mg , calories 30% from fat and 7% or less from saturated fats, daily to have 5 or more servings of fruits and vegetables.  Biometrics: Pre Biometrics - 08/30/17 1131    Pre Biometrics          Height  5\' 10"  (1.778 m)    Weight  278 lb 10.6 oz (126.4 kg)    Waist Circumference  52 inches    Hip Circumference  51.5 inches    Waist to Hip Ratio  1.01 %    BMI (Calculated)  39.98    Triceps Skinfold  30 mm    % Body Fat  40.6 %    Grip Strength  32 kg    Flexibility  8 in    Single Leg Stand  3 seconds            Nutrition Therapy Plan and Nutrition Goals: Nutrition Therapy & Goals - 08/31/17 1605    Nutrition Therapy          Diet  Consistent Carb, Heart Healthy        Personal Nutrition Goals          Nutrition Goal  Pt to identify and limit food sources of saturated fat, trans fat, and sodium    Personal Goal #2  Pt to identify food quantities necessary to achieve weight loss of 6-24 lb (2.7-10.9 kg) at graduation from cardiac rehab. Goal wt of 235 lb desired.         Intervention Plan          Intervention  Prescribe, educate and counsel regarding individualized specific dietary modifications aiming towards targeted core components such as weight, hypertension, lipid management, diabetes, heart failure and other comorbidities.    Expected Outcomes  Short Term  Goal: Understand basic principles of dietary content, such as calories, fat, sodium, cholesterol and nutrients.;Long Term Goal: Adherence to prescribed nutrition plan.           Nutrition Assessments: Nutrition Assessments - 08/31/17 1605    MEDFICTS Scores          Pre Score  51           Nutrition Goals Re-Evaluation:   Nutrition Goals Re-Evaluation:   Nutrition Goals Discharge (Final Nutrition Goals Re-Evaluation):   Psychosocial: Target Goals: Acknowledge presence or absence of significant depression and/or stress, maximize coping skills, provide positive support system. Participant is able to verbalize types and ability to use techniques and skills needed for reducing stress and depression.  Initial Review & Psychosocial Screening: Initial Psych Review & Screening - 08/31/17 0744    Initial Review          Current issues with  None Identified        Family Dynamics          Good Support System?  Yes        Barriers          Psychosocial barriers to participate in program  There are no identifiable barriers or psychosocial needs.        Screening Interventions          Interventions  Encouraged to exercise           Quality of Life Scores: Quality of Life - 09/12/17 1700    Quality of Life Scores          Health/Function Pre  24.7 %    Socioeconomic Pre  24.58 %    Psych/Spiritual Pre  26.67 %    Family Pre  27.3 %    GLOBAL Pre  -- overall QOL scores very good. pt demonstrates positive outlook with good coping skills. pt eager to return to part time work.           Scores of 19 and below usually indicate a poorer quality of life in these areas.  A difference of  2-3 points is a clinically meaningful difference.  A difference of 2-3 points in the total score of the Quality of Life Index has been associated with significant improvement in overall quality of life, self-image, physical symptoms, and general health in studies assessing change in  quality of life.  PHQ-9: Recent Review Flowsheet Data    Depression screen [facility] 2/9 09/07/2017   Decreased Interest 0   Down, Depressed, Hopeless 0   PHQ - 2 Score 0     Interpretation of Total Score  Total Score Depression Severity:  1-4 = Minimal depression, 5-9 = Mild depression, 10-14 = Moderate depression, 15-19 = Moderately severe depression, 20-27 = Severe depression   Psychosocial Evaluation and Intervention: Psychosocial Evaluation - 09/16/17 1535    Psychosocial Evaluation & Interventions          Interventions  Encouraged to exercise with the program and follow exercise prescription    Comments  no psychosocial needs identified, no interventions necessary.  pt would like to find part time job.     Expected Outcomes  pt will exhibit positive outlook with good coping skills.     Continue Psychosocial Services   No Follow up required           Psychosocial Re-Evaluation:   Psychosocial Discharge (Final Psychosocial Re-Evaluation):   Vocational Rehabilitation: Provide vocational rehab assistance to qualifying candidates.   Vocational Rehab Evaluation & Intervention:   Education: Education Goals: Education classes will be provided on a weekly basis, covering required topics. Participant will state understanding/return demonstration of topics presented.  Learning Barriers/Preferences: Learning Barriers/Preferences - 08/30/17 0926    Learning Barriers/Preferences          Learning Barriers  Hearing;Sight    Learning Preferences  Skilled Demonstration;Written Material           Education Topics: Count Your Pulse:  -Group instruction provided by verbal instruction, demonstration, patient participation and written materials to support subject.  Instructors address importance of being able to find your pulse and how to count your pulse when at home without a heart monitor.  Patients get hands on experience counting their pulse with staff help and  individually.   Heart Attack, Angina, and Risk Factor Modification:  -Group instruction provided by verbal instruction, video, and written materials to support subject.  Instructors address signs and symptoms of angina and heart attacks.    Also discuss risk factors for heart disease and how to make changes  to improve heart health risk factors.   Functional Fitness:  -Group instruction provided by verbal instruction, demonstration, patient participation, and written materials to support subject.  Instructors address safety measures for doing things around the house.  Discuss how to get up and down off the floor, how to pick things up properly, how to safely get out of a chair without assistance, and balance training.   Meditation and Mindfulness:  -Group instruction provided by verbal instruction, patient participation, and written materials to support subject.  Instructor addresses importance of mindfulness and meditation practice to help reduce stress and improve awareness.  Instructor also leads participants through a meditation exercise.    Stretching for Flexibility and Mobility:  -Group instruction provided by verbal instruction, patient participation, and written materials to support subject.  Instructors lead participants through series of stretches that are designed to increase flexibility thus improving mobility.  These stretches are additional exercise for major muscle groups that are typically performed during regular warm up and cool down.   Hands Only CPR:  -Group verbal, video, and participation provides a basic overview of AHA guidelines for community CPR. Role-play of emergencies allow participants the opportunity to practice calling for help and chest compression technique with discussion of AED use.   Hypertension: -Group verbal and written instruction that provides a basic overview of hypertension including the most recent diagnostic guidelines, risk factor reduction with  self-care instructions and medication management.    Nutrition I class: Heart Healthy Eating:  -Group instruction provided by PowerPoint slides, verbal discussion, and written materials to support subject matter. The instructor gives an explanation and review of the Therapeutic Lifestyle Changes diet recommendations, which includes a discussion on lipid goals, dietary fat, sodium, fiber, plant stanol/sterol esters, sugar, and the components of a well-balanced, healthy diet. Flowsheet Row CARDIAC REHAB PHASE II EXERCISE from 09/14/2017 in [facility] CARDIAC REHAB  Date  09/13/17  Educator  RD  Instruction Review Code  2- Demonstrated Understanding      Nutrition II class: Lifestyle Skills:  -Group instruction provided by PowerPoint slides, verbal discussion, and written materials to support subject matter. The instructor gives an explanation and review of label reading, grocery shopping for heart health, heart healthy recipe modifications, and ways to make healthier choices when eating out.   Diabetes Question & Answer:  -Group instruction provided by PowerPoint slides, verbal discussion, and written materials to support subject matter. The instructor gives an explanation and review of diabetes co-morbidities, pre- and post-prandial blood glucose goals, pre-exercise blood glucose goals, signs, symptoms, and treatment of hypoglycemia and hyperglycemia, and foot care basics.   Diabetes Blitz:  -Group instruction provided by PowerPoint slides, verbal discussion, and written materials to support subject matter. The instructor gives an explanation and review of the physiology behind type 1 and type 2 diabetes, diabetes medications and rational behind using different medications, pre- and post-prandial blood glucose recommendations and Hemoglobin A1c goals, diabetes diet, and exercise including blood glucose guidelines for exercising safely.    Portion Distortion:  -Group  instruction provided by PowerPoint slides, verbal discussion, written materials, and food models to support subject matter. The instructor gives an explanation of serving size versus portion size, changes in portions sizes over the last 20 years, and what consists of a serving from each food group.   Stress Management:  -Group instruction provided by verbal instruction, video, and written materials to support subject matter.  Instructors review role of stress in heart disease and how to cope  with stress positively.     Exercising on Your Own:  -Group instruction provided by verbal instruction, power point, and written materials to support subject.  Instructors discuss benefits of exercise, components of exercise, frequency and intensity of exercise, and end points for exercise.  Also discuss use of nitroglycerin and activating EMS.  Review options of places to exercise outside of rehab.  Review guidelines for sex with heart disease.   Cardiac Drugs I:  -Group instruction provided by verbal instruction and written materials to support subject.  Instructor reviews cardiac drug classes: antiplatelets, anticoagulants, beta blockers, and statins.  Instructor discusses reasons, side effects, and lifestyle considerations for each drug class.   Cardiac Drugs II:  -Group instruction provided by verbal instruction and written materials to support subject.  Instructor reviews cardiac drug classes: angiotensin converting enzyme inhibitors (ACE-I), angiotensin II receptor blockers (ARBs), nitrates, and calcium channel blockers.  Instructor discusses reasons, side effects, and lifestyle considerations for each drug class.   Anatomy and Physiology of the Circulatory System:  Group verbal and written instruction and models provide basic cardiac anatomy and physiology, with the coronary electrical and arterial systems. Review of: AMI, Angina, Valve disease, Heart Failure, Peripheral Artery Disease, Cardiac  Arrhythmia, Pacemakers, and the ICD. Flowsheet Row CARDIAC REHAB PHASE II EXERCISE from 09/14/2017 in [facility] CARDIAC REHAB  Date  09/14/17  Instruction Review Code  2- Demonstrated Understanding      Other Education:  -Group or individual verbal, written, or video instructions that support the educational goals of the cardiac rehab program.   Holiday Eating Survival Tips:  -Group instruction provided by PowerPoint slides, verbal discussion, and written materials to support subject matter. The instructor gives patients tips, tricks, and techniques to help them not only survive but enjoy the holidays despite the onslaught of food that accompanies the holidays.   Knowledge Questionnaire Score: Knowledge Questionnaire Score - 09/09/17 1552    Knowledge Questionnaire Score          Pre Score  23/24           Core Components/Risk Factors/Patient Goals at Admission: Personal Goals and Risk Factors at Admission - 08/30/17 1129    Core Components/Risk Factors/Patient Goals on Admission           Weight Management  Yes;Obesity    Intervention  Weight Management: Develop a combined nutrition and exercise program designed to reach desired caloric intake, while maintaining appropriate intake of nutrient and fiber, sodium and fats, and appropriate energy expenditure required for the weight goal.;Weight Management/Obesity: Establish reasonable short term and long term weight goals.;Weight Management: Provide education and appropriate resources to help participant work on and attain dietary goals.;Obesity: Provide education and appropriate resources to help participant work on and attain dietary goals.    Admit Weight  278 lb 10.6 oz (126.4 kg)    Goal Weight: Short Term  268 lb (121.6 kg)    Goal Weight: Long Term  235 lb (106.6 kg)    Expected Outcomes  Short Term: Continue to assess and modify interventions until short term weight is achieved;Long Term: Adherence to nutrition  and physical activity/exercise program aimed toward attainment of established weight goal;Weight Loss: Understanding of general recommendations for a balanced deficit meal plan, which promotes 1-2 lb weight loss per week and includes a negative energy balance of [phone] kcal/d;Weight Maintenance: Understanding of the daily nutrition guidelines, which includes 25-35% calories from fat, 7% or less cal from saturated fats, less than 200mg  cholesterol,  less than 1.5gm of sodium, & 5 or more servings of fruits and vegetables daily;Understanding recommendations for meals to include 15-35% energy as protein, 25-35% energy from fat, 35-60% energy from carbohydrates, less than 200mg  of dietary cholesterol, 20-35 gm of total fiber daily;Understanding of distribution of calorie intake throughout the day with the consumption of 4-5 meals/snacks    Diabetes  Yes    Intervention  Provide education about signs/symptoms and action to take for hypo/hyperglycemia.;Provide education about proper nutrition, including hydration, and aerobic/resistive exercise prescription along with prescribed medications to achieve blood glucose in normal ranges: Fasting glucose 65-99 mg/dL    Expected Outcomes  Short Term: Participant verbalizes understanding of the signs/symptoms and immediate care of hyper/hypoglycemia, proper foot care and importance of medication, aerobic/resistive exercise and nutrition plan for blood glucose control.;Long Term: Attainment of HbA1C < 7%.    Hypertension  Yes    Intervention  Provide education on lifestyle modifcations including regular physical activity/exercise, weight management, moderate sodium restriction and increased consumption of fresh fruit, vegetables, and low fat dairy, alcohol moderation, and smoking cessation.;Monitor prescription use compliance.    Expected Outcomes  Short Term: Continued assessment and intervention until BP is < 140/16mm HG in hypertensive participants. < 130/30mm HG in  hypertensive participants with diabetes, heart failure or chronic kidney disease.;Long Term: Maintenance of blood pressure at goal levels.    Lipids  Yes    Intervention  Provide education and support for participant on nutrition & aerobic/resistive exercise along with prescribed medications to achieve LDL 70mg , HDL >40mg .    Expected Outcomes  Short Term: Participant states understanding of desired cholesterol values and is compliant with medications prescribed. Participant is following exercise prescription and nutrition guidelines.;Long Term: Cholesterol controlled with medications as prescribed, with individualized exercise RX and with personalized nutrition plan. Value goals: LDL < 70mg , HDL > 40 mg.           Core Components/Risk Factors/Patient Goals Review:  Goals and Risk Factor Review    Core Components/Risk Factors/Patient Goals Review    Row Name 09/16/17 1530   Personal Goals Review  Weight Management/Obesity;Hypertension;Lipids   Review  pt with multiple CAD RF demonstrates eagerness to participate in CR program. pt reports improved chronic knee pain since beginning CR participation.    Expected Outcomes  pt will participate in CR exercise,nutrition and lifestyle modification opportunities.           Core Components/Risk Factors/Patient Goals at Discharge (Final Review):  Goals and Risk Factor Review - 09/16/17 1530    Core Components/Risk Factors/Patient Goals Review          Personal Goals Review  Weight Management/Obesity;Hypertension;Lipids    Review  pt with multiple CAD RF demonstrates eagerness to participate in CR program. pt reports improved chronic knee pain since beginning CR participation.     Expected Outcomes  pt will participate in CR exercise,nutrition and lifestyle modification opportunities.            ITP Comments: ITP Comments    Row Name 08/30/17 1610 09/22/17 1122   ITP Comments  Dr. Armanda Magic, Medical Director  30 day ITP review. pt  demonstrates willingness to participate in group exercise program.       Comments:

## 2017-09-23 ENCOUNTER — Encounter (HOSPITAL_COMMUNITY)
Admission: RE | Admit: 2017-09-23 | Discharge: 2017-09-23 | Disposition: A | Discharge status: Home / Self Care | Payer: Medicare PPO | Source: Ambulatory Visit | Attending: Cardiology | Admitting: Cardiology

## 2017-09-23 ENCOUNTER — Encounter (HOSPITAL_COMMUNITY): Payer: Medicare PPO

## 2017-09-23 DIAGNOSIS — Z951 Presence of aortocoronary bypass graft: Secondary | ICD-10-CM | POA: Diagnosis not present

## 2017-09-23 LAB — GLUCOSE, CAPILLARY: GLUCOSE-CAPILLARY: 190 mg/dL — AB (ref 65–99)

## 2017-09-26 ENCOUNTER — Encounter (HOSPITAL_COMMUNITY): Payer: Medicare PPO

## 2017-09-26 ENCOUNTER — Encounter (HOSPITAL_COMMUNITY)
Admission: RE | Admit: 2017-09-26 | Discharge: 2017-09-26 | Disposition: A | Discharge status: Home / Self Care | Payer: Medicare PPO | Source: Ambulatory Visit | Attending: Cardiology | Admitting: Cardiology

## 2017-09-26 DIAGNOSIS — Z951 Presence of aortocoronary bypass graft: Secondary | ICD-10-CM

## 2017-09-26 LAB — GLUCOSE, CAPILLARY: Glucose-Capillary: 141 mg/dL — ABNORMAL HIGH (ref 65–99)

## 2017-09-28 ENCOUNTER — Encounter (HOSPITAL_COMMUNITY): Payer: Medicare PPO

## 2017-09-28 ENCOUNTER — Encounter (HOSPITAL_COMMUNITY)
Admission: RE | Admit: 2017-09-28 | Discharge: 2017-09-28 | Disposition: A | Discharge status: Home / Self Care | Payer: Medicare PPO | Source: Ambulatory Visit | Attending: Cardiology | Admitting: Cardiology

## 2017-09-28 DIAGNOSIS — Z951 Presence of aortocoronary bypass graft: Secondary | ICD-10-CM

## 2017-09-30 ENCOUNTER — Encounter (HOSPITAL_COMMUNITY): Payer: Medicare PPO

## 2017-09-30 ENCOUNTER — Encounter (HOSPITAL_COMMUNITY)
Admission: RE | Admit: 2017-09-30 | Discharge: 2017-09-30 | Disposition: A | Discharge status: Home / Self Care | Payer: Medicare PPO | Source: Ambulatory Visit | Attending: Cardiology | Admitting: Cardiology

## 2017-09-30 DIAGNOSIS — Z951 Presence of aortocoronary bypass graft: Secondary | ICD-10-CM | POA: Diagnosis not present

## 2017-10-03 ENCOUNTER — Encounter (HOSPITAL_COMMUNITY): Payer: Medicare PPO

## 2017-10-05 ENCOUNTER — Encounter (HOSPITAL_COMMUNITY): Payer: Medicare PPO

## 2017-10-05 ENCOUNTER — Encounter (HOSPITAL_COMMUNITY)
Admission: RE | Admit: 2017-10-05 | Discharge: 2017-10-05 | Disposition: A | Discharge status: Home / Self Care | Payer: Medicare PPO | Source: Ambulatory Visit | Attending: Cardiology | Admitting: Cardiology

## 2017-10-05 DIAGNOSIS — Z951 Presence of aortocoronary bypass graft: Secondary | ICD-10-CM | POA: Diagnosis not present

## 2017-10-07 ENCOUNTER — Encounter (HOSPITAL_COMMUNITY)
Admission: RE | Admit: 2017-10-07 | Discharge: 2017-10-07 | Disposition: A | Discharge status: Home / Self Care | Payer: Medicare PPO | Source: Ambulatory Visit | Attending: Cardiology | Admitting: Cardiology

## 2017-10-07 ENCOUNTER — Encounter (HOSPITAL_COMMUNITY): Payer: Medicare PPO

## 2017-10-07 DIAGNOSIS — Z951 Presence of aortocoronary bypass graft: Secondary | ICD-10-CM | POA: Diagnosis not present

## 2017-10-07 LAB — GLUCOSE, CAPILLARY
GLUCOSE-CAPILLARY: 119 mg/dL — AB (ref 70–99)
Glucose-Capillary: 82 mg/dL (ref 70–99)

## 2017-10-10 ENCOUNTER — Encounter (HOSPITAL_COMMUNITY)
Admission: RE | Admit: 2017-10-10 | Discharge: 2017-10-10 | Disposition: A | Discharge status: Home / Self Care | Payer: No Typology Code available for payment source | Source: Ambulatory Visit | Attending: Cardiology | Admitting: Cardiology

## 2017-10-10 ENCOUNTER — Encounter (HOSPITAL_COMMUNITY): Payer: No Typology Code available for payment source

## 2017-10-10 DIAGNOSIS — Z951 Presence of aortocoronary bypass graft: Secondary | ICD-10-CM | POA: Diagnosis not present

## 2017-10-10 DIAGNOSIS — E119 Type 2 diabetes mellitus without complications: Secondary | ICD-10-CM | POA: Diagnosis not present

## 2017-10-12 ENCOUNTER — Encounter (HOSPITAL_COMMUNITY)
Admission: RE | Admit: 2017-10-12 | Discharge: 2017-10-12 | Disposition: A | Discharge status: Home / Self Care | Payer: No Typology Code available for payment source | Source: Ambulatory Visit | Attending: Cardiology | Admitting: Cardiology

## 2017-10-12 ENCOUNTER — Encounter (HOSPITAL_COMMUNITY): Payer: No Typology Code available for payment source

## 2017-10-12 DIAGNOSIS — Z951 Presence of aortocoronary bypass graft: Secondary | ICD-10-CM | POA: Diagnosis not present

## 2017-10-14 ENCOUNTER — Encounter (HOSPITAL_COMMUNITY)
Admission: RE | Admit: 2017-10-14 | Discharge: 2017-10-14 | Disposition: A | Discharge status: Home / Self Care | Payer: No Typology Code available for payment source | Source: Ambulatory Visit | Attending: Cardiology | Admitting: Cardiology

## 2017-10-14 ENCOUNTER — Encounter (HOSPITAL_COMMUNITY): Payer: No Typology Code available for payment source

## 2017-10-14 DIAGNOSIS — Z951 Presence of aortocoronary bypass graft: Secondary | ICD-10-CM

## 2017-10-17 ENCOUNTER — Encounter (HOSPITAL_COMMUNITY): Payer: No Typology Code available for payment source

## 2017-10-17 ENCOUNTER — Encounter (HOSPITAL_COMMUNITY)
Admission: RE | Admit: 2017-10-17 | Discharge: 2017-10-17 | Disposition: A | Discharge status: Home / Self Care | Payer: No Typology Code available for payment source | Source: Ambulatory Visit | Attending: Cardiology | Admitting: Cardiology

## 2017-10-17 DIAGNOSIS — Z951 Presence of aortocoronary bypass graft: Secondary | ICD-10-CM | POA: Diagnosis not present

## 2017-10-17 LAB — GLUCOSE, CAPILLARY: GLUCOSE-CAPILLARY: 112 mg/dL — AB (ref 70–99)

## 2017-10-19 ENCOUNTER — Encounter (HOSPITAL_COMMUNITY)
Admission: RE | Admit: 2017-10-19 | Discharge: 2017-10-19 | Disposition: A | Discharge status: Home / Self Care | Payer: No Typology Code available for payment source | Source: Ambulatory Visit | Attending: Cardiology | Admitting: Cardiology

## 2017-10-19 ENCOUNTER — Encounter (HOSPITAL_COMMUNITY): Payer: No Typology Code available for payment source

## 2017-10-19 DIAGNOSIS — Z951 Presence of aortocoronary bypass graft: Secondary | ICD-10-CM | POA: Diagnosis not present

## 2017-10-20 ENCOUNTER — Ambulatory Visit: Payer: Non-veteran care | Admitting: Podiatry

## 2017-10-20 NOTE — Progress Notes (Signed)
Cardiac Individual Treatment Plan  Patient Details  Name: Allen Alexander MRN: [id number] Date of Birth: 09/24/1945 Referring Provider:   Flowsheet Row CARDIAC REHAB PHASE II ORIENTATION from 08/30/2017 in [facility] CARDIAC [facility]  Referring Provider  Humpreys,Holly MD ([facility]'s coverage)      Initial Encounter Date:  Flowsheet Row CARDIAC REHAB PHASE II ORIENTATION from 08/30/2017 in [facility] CARDIAC REHAB  Date  08/30/17      Visit Diagnosis: S/P CABG x 4  Patient's Home Medications on Admission:  Current Outpatient Medications:  .  aspirin 81 MG chewable tablet, Chew 162 mg by mouth daily., Disp: , Rfl:  .  atorvastatin (LIPITOR) 40 MG tablet, Take 40 mg by mouth daily., Disp: , Rfl:  .  Cholecalciferol (VITAMIN D) 2000 units CAPS, Take 2,000 Units by mouth every morning., Disp: , Rfl:  .  diphenhydrAMINE (BENADRYL) 25 MG tablet, Take 25 mg by mouth every 6 (six) hours as needed., Disp: , Rfl:  .  furosemide (LASIX) 20 MG tablet, Take 20 mg by mouth daily. For 7 days; started on 01-21-17, Disp: , Rfl:  .  insulin aspart (NOVOLOG) 100 UNIT/ML injection, Inject 2-10 Units into the skin 3 (three) times daily before meals. Sliding scale. 151-200=2 units. 201-250=4 units.  251-300= 6 units.  351-400=10 units. Greater than 400; call MD, Disp: , Rfl:  .  insulin glargine (LANTUS) 100 UNIT/ML injection, Inject 25 Units into the skin at bedtime. , Disp: , Rfl:  .  isosorbide mononitrate (IMDUR) 60 MG 24 hr tablet, Take 60 mg by mouth daily., Disp: , Rfl:  .  Melatonin 10 MG TABS, Take 1 tablet by mouth at bedtime as needed., Disp: , Rfl:  .  metFORMIN (GLUCOPHAGE-XR) 500 MG 24 hr tablet, Take 2,000 mg by mouth daily with breakfast., Disp: , Rfl:  .  metoprolol tartrate (LOPRESSOR) 25 MG tablet, Take 12.5 mg by mouth 2 (two) times daily. , Disp: , Rfl:  .  Multiple Vitamins-Minerals (MULTIVITAMIN WITH MINERALS) tablet, Take 1 tablet by mouth daily.,  Disp: , Rfl:  .  niacin 100 MG tablet, Take 100 mg by mouth at bedtime., Disp: , Rfl:  .  potassium chloride (K-DUR,KLOR-CON) 10 MEQ tablet, Take by mouth., Disp: , Rfl:   Past Medical History: Past Medical History:  Diagnosis Date  . Diabetes mellitus without complication (HCC)     Tobacco Use: Social History   Tobacco Use  Smoking Status Never Smoker  Smokeless Tobacco Never Used    Labs: Recent Review Advice worker    Labs for ITP Cardiac and Pulmonary Rehab Latest Ref Rng & Units 01/21/2017   PHART 7.350 - 7.450 7.398   PCO2ART 32.0 - 48.0 mmHg 25.4(L)   HCO3 20.0 - 28.0 mmol/L 15.6(L)   TCO2 22 - 32 mmol/L 16(L)   ACIDBASEDEF 0.0 - 2.0 mmol/L 8.0(H)   O2SAT % 96.0      Capillary Blood Glucose: Lab Results  Component Value Date   GLUCAP 112 (H) 10/17/2017   GLUCAP 119 (H) 10/07/2017   GLUCAP 82 10/07/2017   GLUCAP 141 (H) 09/26/2017   GLUCAP 190 (H) 09/23/2017     Exercise Target Goals:    Exercise Program Goal: Individual exercise prescription set using results from initial 6 min walk test and THRR while considering  patient's activity barriers and safety.   Exercise Prescription Goal: Initial exercise prescription builds to 30-45 minutes a day of aerobic activity, 2-3 days per week.  Home exercise guidelines  will be given to patient during program as part of exercise prescription that the participant will acknowledge.  Activity Barriers & Risk Stratification: Activity Barriers & Cardiac Risk Stratification - 08/30/17 0926    Activity Barriers & Cardiac Risk Stratification          Activity Barriers  Balance Concerns;Joint Problems;Deconditioning;Muscular Weakness;Shortness of Breath;Arthritis;Other (comment)    Comments  B knee pain, B neuropathy     Cardiac Risk Stratification  High           6 Minute Walk: 6 Minute Walk    6 Minute Walk    Row Name 08/30/17 1137   Phase  Initial   Distance  1319 feet   Walk Time  6 minutes   # of Rest  Breaks  0   MPH  2.5   METS  1.95   RPE  9   VO2 Peak  6.8   Symptoms  No   Resting HR  71 bpm   Resting BP  104/60   Resting Oxygen Saturation   96 %   Exercise Oxygen Saturation  during 6 min walk  93 %   Max Ex. HR  92 bpm   Max Ex. BP  122/60   2 Minute Post BP  100/60          Oxygen Initial Assessment:   Oxygen Re-Evaluation:   Oxygen Discharge (Final Oxygen Re-Evaluation):   Initial Exercise Prescription: Initial Exercise Prescription - 08/30/17 1100    Date of Initial Exercise RX and Referring Provider          Date  08/30/17    Referring Provider  Humpreys,Holly MD ([facility]'s coverage)        Treadmill          MPH  2    Grade  0    Minutes  15    METs  2.53        NuStep          Level  2    SPM  75    Minutes  15    METs  2        Arm Ergometer          Level  2    Watts  15    Minutes  15    METs  1.6        Prescription Details          Frequency (times per week)  3    Duration  Progress to 30 minutes of continuous aerobic without signs/symptoms of physical distress        Intensity          THRR 40-80% of Max Heartrate  59-118    Ratings of Perceived Exertion  11-15    Perceived Dyspnea  0-4        Progression          Progression  Continue to progress workloads to maintain intensity without signs/symptoms of physical distress.        Resistance Training          Training Prescription  Yes    Weight  3lbs    Reps  10-15           Perform Capillary Blood Glucose checks as needed.  Exercise Prescription Changes: Exercise Prescription Changes    Response to Exercise    Row Name 09/07/17 1203 09/12/17 1201 09/26/17 1548 10/10/17 1545   Blood Pressure (Admit)  110/70  120/60  108/70  120/70   Blood Pressure (Exercise)  146/70  128/70  140/90  122/62   Blood Pressure (Exit)  124/60  120/70  104/58  112/72   Heart Rate (Admit)  82 bpm  59 bpm  77 bpm  84 bpm   Heart Rate (Exercise)  127 bpm  107 bpm  118 bpm  118  bpm   Heart Rate (Exit)  82 bpm  62 bpm  64 bpm  83 bpm   Rating of Perceived Exertion (Exercise)  11  11  10  11    Symptoms  none  none  none  none   Comments  pt oriented to exercise equipment  no documentation  no documentation  no documentation   Duration  Continue with 30 min of aerobic exercise without signs/symptoms of physical distress.  Continue with 30 min of aerobic exercise without signs/symptoms of physical distress.  Continue with 30 min of aerobic exercise without signs/symptoms of physical distress.  Continue with 30 min of aerobic exercise without signs/symptoms of physical distress.   Intensity  THRR unchanged  THRR unchanged  THRR unchanged  THRR unchanged       Progression    Row Name 09/07/17 1203 09/12/17 1201 09/26/17 1548 10/10/17 1545   Progression  Continue to progress workloads to maintain intensity without signs/symptoms of physical distress.  no documentation  Continue to progress workloads to maintain intensity without signs/symptoms of physical distress.  Continue to progress workloads to maintain intensity without signs/symptoms of physical distress.   Average METs  no documentation  no documentation  2.9  2.9       Resistance Training    Row Name 09/07/17 1203 09/12/17 1201 09/26/17 1548 10/10/17 1545   Training Prescription  No pt had to leave early  Yes  Yes  Yes   Weight  no documentation  3lbs  3lbs  4lbs   Reps  no documentation  10-15  10-15  10-15   Time  no documentation  10 Minutes  10 Minutes  10 Minutes       Treadmill    Row Name 09/07/17 1203 09/12/17 1201 09/26/17 1548 10/10/17 1545   MPH  2.2  2.5  2.8  2.8   Grade  0  0  1  2   Minutes  15  15  15  15    METs  2.69  2.91  3.53  3.91       NuStep    Row Name 09/07/17 1203 09/12/17 1201 09/26/17 1548 10/10/17 1545   Level  2  3  3   no documentation   SPM  75  80  80  no documentation   Minutes  15  15  15   no documentation   METs  no documentation  2.8  2.3  no documentation        Arm Ergometer    Row Name 09/07/17 1203 09/12/17 1201 09/26/17 1548 10/10/17 1545   Level  2  no documentation  no documentation  2   Watts  15  no documentation  no documentation  no documentation   Minutes  15  no documentation  no documentation  15   METs  1.6  no documentation  no documentation  2.06       Home Exercise Plan    Row Name 09/07/17 1203 09/12/17 1201 09/26/17 1548 10/10/17 1545   Plans to continue exercise at  no documentation  no documentation  Home (comment) walking  Home (  comment) walking   Frequency  no documentation  no documentation  Add 2 additional days to program exercise sessions.  Add 2 additional days to program exercise sessions.   Initial Home Exercises Provided  no documentation  no documentation  09/16/17  09/16/17          Exercise Comments: Exercise Comments    Row Name 09/20/17 1517 10/11/17 1550   Exercise Comments  Reviewed METs and goals. Pt is tolerating light exercise in cardiac rehab; will continue to monitor activity levels.   Reviewed METs and goals. Pt is tolerating light exercise in cardiac rehab; will continue to monitor activity levels.       Exercise Goals and Review: Exercise Goals    Exercise Goals    Row Name 08/30/17 1610 08/30/17 1132 08/30/17 1134   Increase Physical Activity  Yes  (Pended)   Yes  no documentation   Intervention  Provide advice, education, support and counseling about physical activity/exercise needs.;Develop an individualized exercise prescription for aerobic and resistive training based on initial evaluation findings, risk stratification, comorbidities and participant's personal goals.  (Pended)   Provide advice, education, support and counseling about physical activity/exercise needs.;Develop an individualized exercise prescription for aerobic and resistive training based on initial evaluation findings, risk stratification, comorbidities and participant's personal goals.  no documentation   Expected Outcomes   Short Term: Attend rehab on a regular basis to increase amount of physical activity.;Long Term: Exercising regularly at least 3-5 days a week.;Long Term: Add in home exercise to make exercise part of routine and to increase amount of physical activity.  (Pended)   Short Term: Attend rehab on a regular basis to increase amount of physical activity.;Long Term: Exercising regularly at least 3-5 days a week.;Long Term: Add in home exercise to make exercise part of routine and to increase amount of physical activity.  no documentation   Increase Strength and Stamina  Yes  (Pended)  Be able to walk 1.5 miles without difficulty or SOB  Yes  no documentation Be able to walk 1.5 miles without SOB or extreme fatigue/discomfort   Intervention  Provide advice, education, support and counseling about physical activity/exercise needs.;Develop an individualized exercise prescription for aerobic and resistive training based on initial evaluation findings, risk stratification, comorbidities and participant's personal goals.  (Pended)   Provide advice, education, support and counseling about physical activity/exercise needs.;Develop an individualized exercise prescription for aerobic and resistive training based on initial evaluation findings, risk stratification, comorbidities and participant's personal goals.  no documentation   Expected Outcomes  Short Term: Increase workloads from initial exercise prescription for resistance, speed, and METs.;Short Term: Perform resistance training exercises routinely during rehab and add in resistance training at home;Long Term: Improve cardiorespiratory fitness, muscular endurance and strength as measured by increased METs and functional capacity ( )  (Pended)   Short Term: Increase workloads from initial exercise prescription for resistance, speed, and METs.;Short Term: Perform resistance training exercises routinely during rehab and add in resistance training at home;Long Term: Improve  cardiorespiratory fitness, muscular endurance and strength as measured by increased METs and functional capacity ( )  no documentation   Able to understand and use rate of perceived exertion (RPE) scale  Yes  (Pended)   Yes  no documentation   Intervention  Provide education and explanation on how to use RPE scale  (Pended)   Provide education and explanation on how to use RPE scale  no documentation   Expected Outcomes  Short Term: Able to use RPE daily  in rehab to express subjective intensity level;Long Term:  Able to use RPE to guide intensity level when exercising independently  (Pended)   Short Term: Able to use RPE daily in rehab to express subjective intensity level;Long Term:  Able to use RPE to guide intensity level when exercising independently  no documentation   Able to understand and use Dyspnea scale  Yes  (Pended)   Yes  no documentation   Intervention  Provide education and explanation on how to use Dyspnea scale  (Pended)   Provide education and explanation on how to use Dyspnea scale  no documentation   Expected Outcomes  Short Term: Able to use Dyspnea scale daily in rehab to express subjective sense of shortness of breath during exertion;Long Term: Able to use Dyspnea scale to guide intensity level when exercising independently  (Pended)   Short Term: Able to use Dyspnea scale daily in rehab to express subjective sense of shortness of breath during exertion;Long Term: Able to use Dyspnea scale to guide intensity level when exercising independently  no documentation   Knowledge and understanding of Target Heart Rate Range (THRR)  Yes  (Pended)   Yes  no documentation   Intervention  Provide education and explanation of THRR including how the numbers were predicted and where they are located for reference  (Pended)   Provide education and explanation of THRR including how the numbers were predicted and where they are located for reference  no documentation   Expected Outcomes  Long  Term: Able to use THRR to govern intensity when exercising independently;Short Term: Able to state/look up THRR;Short Term: Able to use daily as guideline for intensity in rehab  (Pended)   Short Term: Able to state/look up THRR;Long Term: Able to use THRR to govern intensity when exercising independently;Short Term: Able to use daily as guideline for intensity in rehab  no documentation   Able to check pulse independently  Yes  (Pended)   Yes  no documentation   Intervention  Provide education and demonstration on how to check pulse in carotid and radial arteries.;Review the importance of being able to check your own pulse for safety during independent exercise  (Pended)   Provide education and demonstration on how to check pulse in carotid and radial arteries.;Review the importance of being able to check your own pulse for safety during independent exercise  no documentation   Expected Outcomes  Short Term: Able to explain why pulse checking is important during independent exercise;Long Term: Able to check pulse independently and accurately  (Pended)   Short Term: Able to explain why pulse checking is important during independent exercise;Long Term: Able to check pulse independently and accurately  no documentation   Understanding of Exercise Prescription  Yes  (Pended)   Yes  no documentation   Intervention  Provide education, explanation, and written materials on patient's individual exercise prescription  (Pended)   Provide education, explanation, and written materials on patient's individual exercise prescription  no documentation   Expected Outcomes  Short Term: Able to explain program exercise prescription;Long Term: Able to explain home exercise prescription to exercise independently  (Pended)   Short Term: Able to explain program exercise prescription;Long Term: Able to explain home exercise prescription to exercise independently  no documentation          Exercise Goals Re-Evaluation  : Exercise Goals Re-Evaluation    Exercise Goal Re-Evaluation    Row Name 09/16/17 1443 09/20/17 1518   Exercise Goals Review  Increase Physical  Activity;Able to understand and use rate of perceived exertion (RPE) scale;Knowledge and understanding of Target Heart Rate Range (THRR);Understanding of Exercise Prescription;Increase Strength and Stamina;Able to check pulse independently  Increase Physical Activity;Able to understand and use rate of perceived exertion (RPE) scale;Knowledge and understanding of Target Heart Rate Range (THRR);Understanding of Exercise Prescription;Increase Strength and Stamina;Able to check pulse independently   Comments  Reviewed home exercise with pt today. Pt plans to go to the [facility] for exercise, 2x/week in addition to coming to cardiac rehab. Reviewed THR, pulse, RPE, sign and symptoms, and when to call 911 or MD.  Also discussed weather considerations and indoor options.  Pt voiced understanding.  Reviewed home exercise with pt in which he plans to go to the [facility] for exercise, 2x/week in addition to coming to cardiac rehab.    Expected Outcomes  Pt will improve in cardirespiratory fitness and functional capacity.   Pt will improve in cardirespiratory fitness and functional capacity.            Discharge Exercise Prescription (Final Exercise Prescription Changes): Exercise Prescription Changes - 10/10/17 1545    Response to Exercise          Blood Pressure (Admit)  120/70    Blood Pressure (Exercise)  122/62    Blood Pressure (Exit)  112/72    Heart Rate (Admit)  84 bpm    Heart Rate (Exercise)  118 bpm    Heart Rate (Exit)  83 bpm    Rating of Perceived Exertion (Exercise)  11    Symptoms  none    Duration  Continue with 30 min of aerobic exercise without signs/symptoms of physical distress.    Intensity  THRR unchanged        Progression          Progression  Continue to progress workloads to maintain intensity without signs/symptoms of physical  distress.    Average METs  2.9        Resistance Training          Training Prescription  Yes    Weight  4lbs    Reps  10-15    Time  10 Minutes        Treadmill          MPH  2.8    Grade  2    Minutes  15    METs  3.91        Arm Ergometer          Level  2    Minutes  15    METs  2.06        Home Exercise Plan          Plans to continue exercise at  Home (comment) walking    Frequency  Add 2 additional days to program exercise sessions.    Initial Home Exercises Provided  09/16/17           Nutrition:  Target Goals: Understanding of nutrition guidelines, daily intake of sodium 1500mg , cholesterol 200mg , calories 30% from fat and 7% or less from saturated fats, daily to have 5 or more servings of fruits and vegetables.  Biometrics: Pre Biometrics - 08/30/17 1131    Pre Biometrics          Height  5\' 10"  (1.778 m)    Weight  278 lb 10.6 oz (126.4 kg)    Waist Circumference  52 inches    Hip Circumference  51.5 inches    Waist to Hip  Ratio  1.01 %    BMI (Calculated)  39.98    Triceps Skinfold  30 mm    % Body Fat  40.6 %    Grip Strength  32 kg    Flexibility  8 in    Single Leg Stand  3 seconds            Nutrition Therapy Plan and Nutrition Goals: Nutrition Therapy & Goals - 08/31/17 1605    Nutrition Therapy          Diet  Consistent Carb, Heart Healthy        Personal Nutrition Goals          Nutrition Goal  Pt to identify and limit food sources of saturated fat, trans fat, and sodium    Personal Goal #2  Pt to identify food quantities necessary to achieve weight loss of 6-24 lb (2.7-10.9 kg) at graduation from cardiac rehab. Goal wt of 235 lb desired.         Intervention Plan          Intervention  Prescribe, educate and counsel regarding individualized specific dietary modifications aiming towards targeted core components such as weight, hypertension, lipid management, diabetes, heart failure and other comorbidities.    Expected  Outcomes  Short Term Goal: Understand basic principles of dietary content, such as calories, fat, sodium, cholesterol and nutrients.;Long Term Goal: Adherence to prescribed nutrition plan.           Nutrition Assessments: Nutrition Assessments - 08/31/17 1605    MEDFICTS Scores          Pre Score  51           Nutrition Goals Re-Evaluation:   Nutrition Goals Re-Evaluation:   Nutrition Goals Discharge (Final Nutrition Goals Re-Evaluation):   Psychosocial: Target Goals: Acknowledge presence or absence of significant depression and/or stress, maximize coping skills, provide positive support system. Participant is able to verbalize types and ability to use techniques and skills needed for reducing stress and depression.  Initial Review & Psychosocial Screening: Initial Psych Review & Screening - 08/31/17 0744    Initial Review          Current issues with  None Identified        Family Dynamics          Good Support System?  Yes        Barriers          Psychosocial barriers to participate in program  There are no identifiable barriers or psychosocial needs.        Screening Interventions          Interventions  Encouraged to exercise           Quality of Life Scores: Quality of Life - 09/12/17 1700    Quality of Life Scores          Health/Function Pre  24.7 %    Socioeconomic Pre  24.58 %    Psych/Spiritual Pre  26.67 %    Family Pre  27.3 %    GLOBAL Pre  -- overall QOL scores very good. pt demonstrates positive outlook with good coping skills. pt eager to return to part time work.           Scores of 19 and below usually indicate a poorer quality of life in these areas.  A difference of  2-3 points is a clinically meaningful difference.  A difference of 2-3 points in the total score of the Quality  of Life Index has been associated with significant improvement in overall quality of life, self-image, physical symptoms, and general health in studies  assessing change in quality of life.  PHQ-9: Recent Review Flowsheet Data    Depression screen [facility] 2/9 09/07/2017   Decreased Interest 0   Down, Depressed, Hopeless 0   PHQ - 2 Score 0     Interpretation of Total Score  Total Score Depression Severity:  1-4 = Minimal depression, 5-9 = Mild depression, 10-14 = Moderate depression, 15-19 = Moderately severe depression, 20-27 = Severe depression   Psychosocial Evaluation and Intervention: Psychosocial Evaluation - 09/16/17 1535    Psychosocial Evaluation & Interventions          Interventions  Encouraged to exercise with the program and follow exercise prescription    Comments  no psychosocial needs identified, no interventions necessary.  pt would like to find part time job.     Expected Outcomes  pt will exhibit positive outlook with good coping skills.     Continue Psychosocial Services   No Follow up required           Psychosocial Re-Evaluation: Psychosocial Re-Evaluation    Psychosocial Re-Evaluation    Row Name 10/11/17 [phone]   Current issues with  None Identified   Comments  pt encouraged to continue exercise with CR program. pt proactively seeking PT employment, with several promising leads. otherwise no psychosocial needs identified, no interventions necessary.    Expected Outcomes  pt will exhibit positive outlook with good coping skills.    Interventions  Encouraged to attend Cardiac Rehabilitation for the exercise   Continue Psychosocial Services   No Follow up required          Psychosocial Discharge (Final Psychosocial Re-Evaluation): Psychosocial Re-Evaluation - 10/11/17 0714    Psychosocial Re-Evaluation          Current issues with  None Identified    Comments  pt encouraged to continue exercise with CR program. pt proactively seeking PT employment, with several promising leads. otherwise no psychosocial needs identified, no interventions necessary.     Expected Outcomes  pt will exhibit positive outlook  with good coping skills.     Interventions  Encouraged to attend Cardiac Rehabilitation for the exercise    Continue Psychosocial Services   No Follow up required           Vocational Rehabilitation: Provide vocational rehab assistance to qualifying candidates.   Vocational Rehab Evaluation & Intervention:   Education: Education Goals: Education classes will be provided on a weekly basis, covering required topics. Participant will state understanding/return demonstration of topics presented.  Learning Barriers/Preferences: Learning Barriers/Preferences - 08/30/17 0926    Learning Barriers/Preferences          Learning Barriers  Hearing;Sight    Learning Preferences  Skilled Demonstration;Written Material           Education Topics: Count Your Pulse:  -Group instruction provided by verbal instruction, demonstration, patient participation and written materials to support subject.  Instructors address importance of being able to find your pulse and how to count your pulse when at home without a heart monitor.  Patients get hands on experience counting their pulse with staff help and individually.   Heart Attack, Angina, and Risk Factor Modification:  -Group instruction provided by verbal instruction, video, and written materials to support subject.  Instructors address signs and symptoms of angina and heart attacks.    Also discuss risk factors for heart disease and how  to make changes to improve heart health risk factors.   Functional Fitness:  -Group instruction provided by verbal instruction, demonstration, patient participation, and written materials to support subject.  Instructors address safety measures for doing things around the house.  Discuss how to get up and down off the floor, how to pick things up properly, how to safely get out of a chair without assistance, and balance training.   Meditation and Mindfulness:  -Group instruction provided by verbal instruction,  patient participation, and written materials to support subject.  Instructor addresses importance of mindfulness and meditation practice to help reduce stress and improve awareness.  Instructor also leads participants through a meditation exercise.    Stretching for Flexibility and Mobility:  -Group instruction provided by verbal instruction, patient participation, and written materials to support subject.  Instructors lead participants through series of stretches that are designed to increase flexibility thus improving mobility.  These stretches are additional exercise for major muscle groups that are typically performed during regular warm up and cool down.   Hands Only CPR:  -Group verbal, video, and participation provides a basic overview of AHA guidelines for community CPR. Role-play of emergencies allow participants the opportunity to practice calling for help and chest compression technique with discussion of AED use.   Hypertension: -Group verbal and written instruction that provides a basic overview of hypertension including the most recent diagnostic guidelines, risk factor reduction with self-care instructions and medication management.    Nutrition I class: Heart Healthy Eating:  -Group instruction provided by PowerPoint slides, verbal discussion, and written materials to support subject matter. The instructor gives an explanation and review of the Therapeutic Lifestyle Changes diet recommendations, which includes a discussion on lipid goals, dietary fat, sodium, fiber, plant stanol/sterol esters, sugar, and the components of a well-balanced, healthy diet. Flowsheet Row CARDIAC REHAB PHASE II EXERCISE from 10/19/2017 in [facility] CARDIAC REHAB  Date  09/13/17  Educator  RD  Instruction Review Code  2- Demonstrated Understanding      Nutrition II class: Lifestyle Skills:  -Group instruction provided by PowerPoint slides, verbal discussion, and written materials  to support subject matter. The instructor gives an explanation and review of label reading, grocery shopping for heart health, heart healthy recipe modifications, and ways to make healthier choices when eating out.   Diabetes Question & Answer:  -Group instruction provided by PowerPoint slides, verbal discussion, and written materials to support subject matter. The instructor gives an explanation and review of diabetes co-morbidities, pre- and post-prandial blood glucose goals, pre-exercise blood glucose goals, signs, symptoms, and treatment of hypoglycemia and hyperglycemia, and foot care basics.   Diabetes Blitz:  -Group instruction provided by PowerPoint slides, verbal discussion, and written materials to support subject matter. The instructor gives an explanation and review of the physiology behind type 1 and type 2 diabetes, diabetes medications and rational behind using different medications, pre- and post-prandial blood glucose recommendations and Hemoglobin A1c goals, diabetes diet, and exercise including blood glucose guidelines for exercising safely.    Portion Distortion:  -Group instruction provided by PowerPoint slides, verbal discussion, written materials, and food models to support subject matter. The instructor gives an explanation of serving size versus portion size, changes in portions sizes over the last 20 years, and what consists of a serving from each food group.   Stress Management:  -Group instruction provided by verbal instruction, video, and written materials to support subject matter.  Instructors review role of stress in heart disease and  how to cope with stress positively.     Exercising on Your Own:  -Group instruction provided by verbal instruction, power point, and written materials to support subject.  Instructors discuss benefits of exercise, components of exercise, frequency and intensity of exercise, and end points for exercise.  Also discuss use of  nitroglycerin and activating EMS.  Review options of places to exercise outside of rehab.  Review guidelines for sex with heart disease. Flowsheet Row CARDIAC REHAB PHASE II EXERCISE from 10/19/2017 in [facility] CARDIAC REHAB  Date  10/05/17  Educator  EP  Instruction Review Code  2- Demonstrated Understanding      Cardiac Drugs I:  -Group instruction provided by verbal instruction and written materials to support subject.  Instructor reviews cardiac drug classes: antiplatelets, anticoagulants, beta blockers, and statins.  Instructor discusses reasons, side effects, and lifestyle considerations for each drug class. Flowsheet Row CARDIAC REHAB PHASE II EXERCISE from 10/19/2017 in [facility] CARDIAC REHAB  Date  10/19/17  Educator  Pharmacist  Instruction Review Code  2- Demonstrated Understanding      Cardiac Drugs II:  -Group instruction provided by verbal instruction and written materials to support subject.  Instructor reviews cardiac drug classes: angiotensin converting enzyme inhibitors (ACE-I), angiotensin II receptor blockers (ARBs), nitrates, and calcium channel blockers.  Instructor discusses reasons, side effects, and lifestyle considerations for each drug class.   Anatomy and Physiology of the Circulatory System:  Group verbal and written instruction and models provide basic cardiac anatomy and physiology, with the coronary electrical and arterial systems. Review of: AMI, Angina, Valve disease, Heart Failure, Peripheral Artery Disease, Cardiac Arrhythmia, Pacemakers, and the ICD. Flowsheet Row CARDIAC REHAB PHASE II EXERCISE from 10/19/2017 in [facility] CARDIAC REHAB  Date  09/14/17  Instruction Review Code  2- Demonstrated Understanding      Other Education:  -Group or individual verbal, written, or video instructions that support the educational goals of the cardiac rehab program.   Holiday Eating Survival Tips:   -Group instruction provided by PowerPoint slides, verbal discussion, and written materials to support subject matter. The instructor gives patients tips, tricks, and techniques to help them not only survive but enjoy the holidays despite the onslaught of food that accompanies the holidays.   Knowledge Questionnaire Score: Knowledge Questionnaire Score - 09/09/17 1552    Knowledge Questionnaire Score          Pre Score  23/24           Core Components/Risk Factors/Patient Goals at Admission: Personal Goals and Risk Factors at Admission - 08/30/17 1129    Core Components/Risk Factors/Patient Goals on Admission           Weight Management  Yes;Obesity    Intervention  Weight Management: Develop a combined nutrition and exercise program designed to reach desired caloric intake, while maintaining appropriate intake of nutrient and fiber, sodium and fats, and appropriate energy expenditure required for the weight goal.;Weight Management/Obesity: Establish reasonable short term and long term weight goals.;Weight Management: Provide education and appropriate resources to help participant work on and attain dietary goals.;Obesity: Provide education and appropriate resources to help participant work on and attain dietary goals.    Admit Weight  278 lb 10.6 oz (126.4 kg)    Goal Weight: Short Term  268 lb (121.6 kg)    Goal Weight: Long Term  235 lb (106.6 kg)    Expected Outcomes  Short Term: Continue to assess and modify interventions  until short term weight is achieved;Long Term: Adherence to nutrition and physical activity/exercise program aimed toward attainment of established weight goal;Weight Loss: Understanding of general recommendations for a balanced deficit meal plan, which promotes 1-2 lb weight loss per week and includes a negative energy balance of [phone] kcal/d;Weight Maintenance: Understanding of the daily nutrition guidelines, which includes 25-35% calories from fat, 7% or less cal  from saturated fats, less than 200mg  cholesterol, less than 1.5gm of sodium, & 5 or more servings of fruits and vegetables daily;Understanding recommendations for meals to include 15-35% energy as protein, 25-35% energy from fat, 35-60% energy from carbohydrates, less than 200mg  of dietary cholesterol, 20-35 gm of total fiber daily;Understanding of distribution of calorie intake throughout the day with the consumption of 4-5 meals/snacks    Diabetes  Yes    Intervention  Provide education about signs/symptoms and action to take for hypo/hyperglycemia.;Provide education about proper nutrition, including hydration, and aerobic/resistive exercise prescription along with prescribed medications to achieve blood glucose in normal ranges: Fasting glucose 65-99 mg/dL    Expected Outcomes  Short Term: Participant verbalizes understanding of the signs/symptoms and immediate care of hyper/hypoglycemia, proper foot care and importance of medication, aerobic/resistive exercise and nutrition plan for blood glucose control.;Long Term: Attainment of HbA1C < 7%.    Hypertension  Yes    Intervention  Provide education on lifestyle modifcations including regular physical activity/exercise, weight management, moderate sodium restriction and increased consumption of fresh fruit, vegetables, and low fat dairy, alcohol moderation, and smoking cessation.;Monitor prescription use compliance.    Expected Outcomes  Short Term: Continued assessment and intervention until BP is < 140/35mm HG in hypertensive participants. < 130/83mm HG in hypertensive participants with diabetes, heart failure or chronic kidney disease.;Long Term: Maintenance of blood pressure at goal levels.    Lipids  Yes    Intervention  Provide education and support for participant on nutrition & aerobic/resistive exercise along with prescribed medications to achieve LDL 70mg , HDL >40mg .    Expected Outcomes  Short Term: Participant states understanding of desired  cholesterol values and is compliant with medications prescribed. Participant is following exercise prescription and nutrition guidelines.;Long Term: Cholesterol controlled with medications as prescribed, with individualized exercise RX and with personalized nutrition plan. Value goals: LDL < 70mg , HDL > 40 mg.           Core Components/Risk Factors/Patient Goals Review:  Goals and Risk Factor Review    Core Components/Risk Factors/Patient Goals Review    Row Name 09/16/17 1530 10/11/17 0713   Personal Goals Review  Weight Management/Obesity;Hypertension;Lipids  Weight Management/Obesity;Hypertension;Lipids   Review  pt with multiple CAD RF demonstrates eagerness to participate in CR program. pt reports improved chronic knee pain since beginning CR participation.   pt with multiple CAD RF demonstrates eagerness to participate in CR program. pt reports improved chronic knee pain since beginning CR participation thus increasing workloads accordingly.    Expected Outcomes  pt will participate in CR exercise,nutrition and lifestyle modification opportunities.   pt will participate in CR exercise,nutrition and lifestyle modification opportunities.           Core Components/Risk Factors/Patient Goals at Discharge (Final Review):  Goals and Risk Factor Review - 10/11/17 0713    Core Components/Risk Factors/Patient Goals Review          Personal Goals Review  Weight Management/Obesity;Hypertension;Lipids    Review  pt with multiple CAD RF demonstrates eagerness to participate in CR program. pt reports improved chronic knee pain since beginning CR  participation thus increasing workloads accordingly.     Expected Outcomes  pt will participate in CR exercise,nutrition and lifestyle modification opportunities.            ITP Comments: ITP Comments    Row Name 08/30/17 0921 09/22/17 1122 10/11/17 0713   ITP Comments  Dr. Armanda Magic, Medical Director  30 day ITP review. pt demonstrates  willingness to participate in group exercise program.   30 day ITP review. pt demonstrates increased eagerness  to participate in group exercise program.       Comments:

## 2017-10-21 ENCOUNTER — Encounter (HOSPITAL_COMMUNITY)
Admission: RE | Admit: 2017-10-21 | Discharge: 2017-10-21 | Disposition: A | Discharge status: Home / Self Care | Payer: No Typology Code available for payment source | Source: Ambulatory Visit | Attending: Cardiology | Admitting: Cardiology

## 2017-10-21 ENCOUNTER — Encounter (HOSPITAL_COMMUNITY): Payer: No Typology Code available for payment source

## 2017-10-21 DIAGNOSIS — Z951 Presence of aortocoronary bypass graft: Secondary | ICD-10-CM | POA: Diagnosis not present

## 2017-10-24 ENCOUNTER — Encounter (HOSPITAL_COMMUNITY): Payer: No Typology Code available for payment source

## 2017-10-24 ENCOUNTER — Encounter (HOSPITAL_COMMUNITY)
Admission: RE | Admit: 2017-10-24 | Discharge: 2017-10-24 | Disposition: A | Discharge status: Home / Self Care | Payer: No Typology Code available for payment source | Source: Ambulatory Visit | Attending: Cardiology | Admitting: Cardiology

## 2017-10-24 DIAGNOSIS — Z951 Presence of aortocoronary bypass graft: Secondary | ICD-10-CM

## 2017-10-26 ENCOUNTER — Encounter (HOSPITAL_COMMUNITY): Payer: No Typology Code available for payment source

## 2017-10-26 ENCOUNTER — Encounter (HOSPITAL_COMMUNITY)
Admission: RE | Admit: 2017-10-26 | Discharge: 2017-10-26 | Disposition: A | Discharge status: Home / Self Care | Payer: No Typology Code available for payment source | Source: Ambulatory Visit | Attending: Cardiology | Admitting: Cardiology

## 2017-10-26 DIAGNOSIS — Z951 Presence of aortocoronary bypass graft: Secondary | ICD-10-CM | POA: Diagnosis not present

## 2017-10-28 ENCOUNTER — Encounter (HOSPITAL_COMMUNITY)
Admission: RE | Admit: 2017-10-28 | Discharge: 2017-10-28 | Disposition: A | Discharge status: Home / Self Care | Payer: No Typology Code available for payment source | Source: Ambulatory Visit | Attending: Cardiology | Admitting: Cardiology

## 2017-10-28 ENCOUNTER — Encounter (HOSPITAL_COMMUNITY): Payer: No Typology Code available for payment source

## 2017-10-28 DIAGNOSIS — Z951 Presence of aortocoronary bypass graft: Secondary | ICD-10-CM

## 2017-10-31 ENCOUNTER — Encounter (HOSPITAL_COMMUNITY)
Admission: RE | Admit: 2017-10-31 | Discharge: 2017-10-31 | Disposition: A | Discharge status: Home / Self Care | Payer: No Typology Code available for payment source | Source: Ambulatory Visit | Attending: Cardiology | Admitting: Cardiology

## 2017-10-31 ENCOUNTER — Encounter (HOSPITAL_COMMUNITY): Payer: No Typology Code available for payment source

## 2017-10-31 DIAGNOSIS — Z951 Presence of aortocoronary bypass graft: Secondary | ICD-10-CM

## 2017-11-02 ENCOUNTER — Encounter (HOSPITAL_COMMUNITY)
Admission: RE | Admit: 2017-11-02 | Discharge: 2017-11-02 | Disposition: A | Discharge status: Home / Self Care | Payer: No Typology Code available for payment source | Source: Ambulatory Visit | Attending: Cardiology | Admitting: Cardiology

## 2017-11-02 ENCOUNTER — Encounter (HOSPITAL_COMMUNITY): Payer: No Typology Code available for payment source

## 2017-11-02 DIAGNOSIS — Z951 Presence of aortocoronary bypass graft: Secondary | ICD-10-CM

## 2017-11-04 ENCOUNTER — Encounter (HOSPITAL_COMMUNITY): Payer: No Typology Code available for payment source

## 2017-11-04 ENCOUNTER — Encounter (HOSPITAL_COMMUNITY)
Admission: RE | Admit: 2017-11-04 | Discharge: 2017-11-04 | Disposition: A | Discharge status: Home / Self Care | Payer: No Typology Code available for payment source | Source: Ambulatory Visit | Attending: Cardiology | Admitting: Cardiology

## 2017-11-04 DIAGNOSIS — Z951 Presence of aortocoronary bypass graft: Secondary | ICD-10-CM

## 2017-11-07 ENCOUNTER — Encounter (HOSPITAL_COMMUNITY): Payer: No Typology Code available for payment source

## 2017-11-07 ENCOUNTER — Encounter (HOSPITAL_COMMUNITY)
Admission: RE | Admit: 2017-11-07 | Discharge: 2017-11-07 | Disposition: A | Discharge status: Home / Self Care | Payer: No Typology Code available for payment source | Source: Ambulatory Visit | Attending: Cardiology | Admitting: Cardiology

## 2017-11-07 DIAGNOSIS — Z951 Presence of aortocoronary bypass graft: Secondary | ICD-10-CM

## 2017-11-09 ENCOUNTER — Encounter (HOSPITAL_COMMUNITY)
Admission: RE | Admit: 2017-11-09 | Discharge: 2017-11-09 | Disposition: A | Discharge status: Home / Self Care | Payer: No Typology Code available for payment source | Source: Ambulatory Visit | Attending: Cardiology | Admitting: Cardiology

## 2017-11-09 ENCOUNTER — Encounter (HOSPITAL_COMMUNITY): Payer: No Typology Code available for payment source

## 2017-11-09 DIAGNOSIS — Z951 Presence of aortocoronary bypass graft: Secondary | ICD-10-CM | POA: Diagnosis not present

## 2017-11-11 ENCOUNTER — Encounter (HOSPITAL_COMMUNITY): Payer: No Typology Code available for payment source

## 2017-11-11 ENCOUNTER — Encounter (HOSPITAL_COMMUNITY)
Admission: RE | Admit: 2017-11-11 | Discharge: 2017-11-11 | Disposition: A | Discharge status: Home / Self Care | Payer: No Typology Code available for payment source | Source: Ambulatory Visit | Attending: Cardiology | Admitting: Cardiology

## 2017-11-11 DIAGNOSIS — E119 Type 2 diabetes mellitus without complications: Secondary | ICD-10-CM | POA: Diagnosis not present

## 2017-11-11 DIAGNOSIS — Z951 Presence of aortocoronary bypass graft: Secondary | ICD-10-CM | POA: Insufficient documentation

## 2017-11-14 ENCOUNTER — Encounter (HOSPITAL_COMMUNITY): Payer: No Typology Code available for payment source

## 2017-11-15 ENCOUNTER — Encounter (HOSPITAL_COMMUNITY): Payer: Self-pay

## 2017-11-16 ENCOUNTER — Encounter (HOSPITAL_COMMUNITY)
Admission: RE | Admit: 2017-11-16 | Discharge: 2017-11-16 | Disposition: A | Discharge status: Home / Self Care | Payer: No Typology Code available for payment source | Source: Ambulatory Visit | Attending: Cardiology | Admitting: Cardiology

## 2017-11-16 ENCOUNTER — Encounter (HOSPITAL_COMMUNITY): Payer: Self-pay

## 2017-11-16 ENCOUNTER — Encounter (HOSPITAL_COMMUNITY): Payer: No Typology Code available for payment source

## 2017-11-16 VITALS — Ht 70.0 in | Wt 272.9 lb

## 2017-11-16 DIAGNOSIS — Z951 Presence of aortocoronary bypass graft: Secondary | ICD-10-CM

## 2017-11-16 NOTE — Progress Notes (Signed)
Cardiac Individual Treatment Plan  Patient Details  Name: Allen Alexander MRN: [id number] Date of Birth: 1945-09-04 Referring Provider:   Flowsheet Row CARDIAC REHAB PHASE II ORIENTATION from 08/30/2017 in [facility] CARDIAC [facility]  Referring Provider  Humpreys,Holly MD ([facility]er's coverage)      Initial Encounter Date:  Flowsheet Row CARDIAC REHAB PHASE II ORIENTATION from 08/30/2017 in [facility] CARDIAC REHAB  Date  08/30/17      Visit Diagnosis: S/P CABG x 4  Patient's Home Medications on Admission:  Current Outpatient Medications:  .  aspirin 81 MG chewable tablet, Chew 162 mg by mouth daily., Disp: , Rfl:  .  atorvastatin (LIPITOR) 40 MG tablet, Take 40 mg by mouth daily., Disp: , Rfl:  .  Cholecalciferol (VITAMIN D) 2000 units CAPS, Take 2,000 Units by mouth every morning., Disp: , Rfl:  .  diphenhydrAMINE (BENADRYL) 25 MG tablet, Take 25 mg by mouth every 6 (six) hours as needed., Disp: , Rfl:  .  furosemide (LASIX) 20 MG tablet, Take 20 mg by mouth daily. For 7 days; started on 01-21-17, Disp: , Rfl:  .  insulin aspart (NOVOLOG) 100 UNIT/ML injection, Inject 2-10 Units into the skin 3 (three) times daily before meals. Sliding scale. 151-200=2 units. 201-250=4 units.  251-300= 6 units.  351-400=10 units. Greater than 400; call MD, Disp: , Rfl:  .  insulin glargine (LANTUS) 100 UNIT/ML injection, Inject 25 Units into the skin at bedtime. , Disp: , Rfl:  .  isosorbide mononitrate (IMDUR) 60 MG 24 hr tablet, Take 60 mg by mouth daily., Disp: , Rfl:  .  Melatonin 10 MG TABS, Take 1 tablet by mouth at bedtime as needed., Disp: , Rfl:  .  metFORMIN (GLUCOPHAGE-XR) 500 MG 24 hr tablet, Take 2,000 mg by mouth daily with breakfast., Disp: , Rfl:  .  metoprolol tartrate (LOPRESSOR) 25 MG tablet, Take 12.5 mg by mouth 2 (two) times daily. , Disp: , Rfl:  .  Multiple Vitamins-Minerals (MULTIVITAMIN WITH MINERALS) tablet, Take 1 tablet by mouth daily.,  Disp: , Rfl:  .  niacin 100 MG tablet, Take 100 mg by mouth at bedtime., Disp: , Rfl:  .  potassium chloride (K-DUR,KLOR-CON) 10 MEQ tablet, Take by mouth., Disp: , Rfl:   Past Medical History: Past Medical History:  Diagnosis Date  . Diabetes mellitus without complication (HCC)     Tobacco Use: Social History   Tobacco Use  Smoking Status Never Smoker  Smokeless Tobacco Never Used    Labs: Recent Review Advice worker    Labs for ITP Cardiac and Pulmonary Rehab Latest Ref Rng & Units 01/21/2017   PHART 7.350 - 7.450 7.398   PCO2ART 32.0 - 48.0 mmHg 25.4(L)   HCO3 20.0 - 28.0 mmol/L 15.6(L)   TCO2 22 - 32 mmol/L 16(L)   ACIDBASEDEF 0.0 - 2.0 mmol/L 8.0(H)   O2SAT % 96.0      Capillary Blood Glucose: Lab Results  Component Value Date   GLUCAP 112 (H) 10/17/2017   GLUCAP 119 (H) 10/07/2017   GLUCAP 82 10/07/2017   GLUCAP 141 (H) 09/26/2017   GLUCAP 190 (H) 09/23/2017     Exercise Target Goals:    Exercise Program Goal: Individual exercise prescription set using results from initial 6 min walk test and THRR while considering  patient's activity barriers and safety.   Exercise Prescription Goal: Initial exercise prescription builds to 30-45 minutes a day of aerobic activity, 2-3 days per week.  Home exercise guidelines  will be given to patient during program as part of exercise prescription that the participant will acknowledge.  Activity Barriers & Risk Stratification: Activity Barriers & Cardiac Risk Stratification - 08/30/17 0926    Activity Barriers & Cardiac Risk Stratification          Activity Barriers  Balance Concerns;Joint Problems;Deconditioning;Muscular Weakness;Shortness of Breath;Arthritis;Other (comment)    Comments  B knee pain, B neuropathy     Cardiac Risk Stratification  High           6 Minute Walk: 6 Minute Walk    6 Minute Walk    Row Name 08/30/17 1137   Phase  Initial   Distance  1319 feet   Walk Time  6 minutes   # of Rest  Breaks  0   MPH  2.5   METS  1.95   RPE  9   VO2 Peak  6.8   Symptoms  No   Resting HR  71 bpm   Resting BP  104/60   Resting Oxygen Saturation   96 %   Exercise Oxygen Saturation  during 6 min walk  93 %   Max Ex. HR  92 bpm   Max Ex. BP  122/60   2 Minute Post BP  100/60          Oxygen Initial Assessment:   Oxygen Re-Evaluation:   Oxygen Discharge (Final Oxygen Re-Evaluation):   Initial Exercise Prescription: Initial Exercise Prescription - 08/30/17 1100    Date of Initial Exercise RX and Referring Provider          Date  08/30/17    Referring Provider  Humpreys,Holly MD ([facility]'s coverage)        Treadmill          MPH  2    Grade  0    Minutes  15    METs  2.53        NuStep          Level  2    SPM  75    Minutes  15    METs  2        Arm Ergometer          Level  2    Watts  15    Minutes  15    METs  1.6        Prescription Details          Frequency (times per week)  3    Duration  Progress to 30 minutes of continuous aerobic without signs/symptoms of physical distress        Intensity          THRR 40-80% of Max Heartrate  59-118    Ratings of Perceived Exertion  11-15    Perceived Dyspnea  0-4        Progression          Progression  Continue to progress workloads to maintain intensity without signs/symptoms of physical distress.        Resistance Training          Training Prescription  Yes    Weight  3lbs    Reps  10-15           Perform Capillary Blood Glucose checks as needed.  Exercise Prescription Changes: Exercise Prescription Changes    Response to Exercise    Row Name 09/07/17 1203 09/12/17 1201 09/26/17 1548 10/10/17 1545 10/24/17 1228   Blood Pressure (Admit)  110/70  120/60  108/70  120/70  116/70   Blood Pressure (Exercise)  146/70  128/70  140/90  122/62  150/80   Blood Pressure (Exit)  124/60  120/70  104/58  112/72  106/70   Heart Rate (Admit)  82 bpm  59 bpm  77 bpm  84 bpm  73 bpm   Heart  Rate (Exercise)  127 bpm  107 bpm  118 bpm  118 bpm  94 bpm   Heart Rate (Exit)  82 bpm  62 bpm  64 bpm  83 bpm  73 bpm   Rating of Perceived Exertion (Exercise)  11  11  10  11  11    Symptoms  none  none  none  none  none   Comments  pt oriented to exercise equipment  no documentation  no documentation  no documentation  no documentation   Duration  Continue with 30 min of aerobic exercise without signs/symptoms of physical distress.  Continue with 30 min of aerobic exercise without signs/symptoms of physical distress.  Continue with 30 min of aerobic exercise without signs/symptoms of physical distress.  Continue with 30 min of aerobic exercise without signs/symptoms of physical distress.  Continue with 30 min of aerobic exercise without signs/symptoms of physical distress.   Intensity  THRR unchanged  THRR unchanged  THRR unchanged  THRR unchanged  THRR unchanged       Progression    Row Name 09/07/17 1203 09/12/17 1201 09/26/17 1548 10/10/17 1545 10/24/17 1228   Progression  Continue to progress workloads to maintain intensity without signs/symptoms of physical distress.  no documentation  Continue to progress workloads to maintain intensity without signs/symptoms of physical distress.  Continue to progress workloads to maintain intensity without signs/symptoms of physical distress.  Continue to progress workloads to maintain intensity without signs/symptoms of physical distress.   Average METs  no documentation  no documentation  2.9  2.9  3.5       Resistance Training    Row Name 09/07/17 1203 09/12/17 1201 09/26/17 1548 10/10/17 1545 10/24/17 1228   Training Prescription  No pt had to leave early  Yes  Yes  Yes  Yes   Weight  no documentation  3lbs  3lbs  4lbs  4lbs   Reps  no documentation  10-15  10-15  10-15  10-15   Time  no documentation  10 Minutes  10 Minutes  10 Minutes  10 Minutes       Treadmill    Row Name 09/07/17 1203 09/12/17 1201 09/26/17 1548 10/10/17 1545 10/24/17 1228    MPH  2.2  2.5  2.8  2.8  2.8   Grade  0  0  1  2  2    Minutes  15  15  15  15  10    METs  2.69  2.91  3.53  3.91  3.91       NuStep    Row Name 09/07/17 1203 09/12/17 1201 09/26/17 1548 10/10/17 1545 10/24/17 1228   Level  2  3  3   no documentation  5   SPM  75  80  80  no documentation  80   Minutes  15  15  15   no documentation  10   METs  no documentation  2.8  2.3  no documentation  3       Arm Ergometer    Row Name 09/07/17 1203 09/12/17 1201 09/26/17 1548 10/10/17 1545 10/24/17 1228   Level  2  no documentation  no documentation  2  3   Watts  15  no documentation  no documentation  no documentation  no documentation   Minutes  15  no documentation  no documentation  15  10   METs  1.6  no documentation  no documentation  2.06  no documentation       Home Exercise Plan    Row Name 09/07/17 1203 09/12/17 1201 09/26/17 1548 10/10/17 1545 10/24/17 1228   Plans to continue exercise at  no documentation  no documentation  Home (comment) walking  Home (comment) walking  Home (comment) walking   Frequency  no documentation  no documentation  Add 2 additional days to program exercise sessions.  Add 2 additional days to program exercise sessions.  Add 2 additional days to program exercise sessions.   Initial Home Exercises Provided  no documentation  no documentation  09/16/17  09/16/17  09/16/17       Response to Exercise    Row Name 11/11/17 1500   Blood Pressure (Admit)  106/62   Blood Pressure (Exercise)  144/64   Blood Pressure (Exit)  114/64   Heart Rate (Admit)  58 bpm   Heart Rate (Exercise)  108 bpm   Heart Rate (Exit)  81 bpm   Rating of Perceived Exertion (Exercise)  12   Symptoms  none   Duration  Continue with 30 min of aerobic exercise without signs/symptoms of physical distress.   Intensity  THRR unchanged       Progression    Row Name 11/11/17 1500   Progression  Continue to progress workloads to maintain intensity without signs/symptoms of physical  distress.   Average METs  3.7       Resistance Training    Row Name 11/11/17 1500   Training Prescription  Yes   Weight  4lbs   Reps  10-15   Time  10 Minutes       Treadmill    Row Name 11/11/17 1500   MPH  2.9   Grade  2   Minutes  10   METs  4.02       NuStep    Row Name 11/11/17 1500   Level  6   SPM  80   Minutes  10   METs  3.4       Arm Ergometer    Row Name 11/11/17 1500   Level  3   Minutes  10       Home Exercise Plan    Row Name 11/11/17 1500   Plans to continue exercise at  Home (comment) walking   Frequency  Add 2 additional days to program exercise sessions.   Initial Home Exercises Provided  09/16/17          Exercise Comments: Exercise Comments    Row Name 09/20/17 1517 10/11/17 1550 10/20/17 1415 11/08/17 1623   Exercise Comments  Reviewed METs and goals. Pt is tolerating light exercise in cardiac rehab; will continue to monitor activity levels.   Reviewed METs and goals. Pt is tolerating light exercise in cardiac rehab; will continue to monitor activity levels.   Reviewed METs and goals. Pt is tolerating light exercise in cardiac rehab; will continue to monitor activity levels.   Reviewed METs and goals. Pt is tolerating light exercise in cardiac rehab; will continue to monitor activity levels.       Exercise Goals and Review: Exercise Goals    Exercise Goals  Row Name 08/30/17 1610 08/30/17 1132 08/30/17 1134   Increase Physical Activity  Yes  (Pended)   Yes  no documentation   Intervention  Provide advice, education, support and counseling about physical activity/exercise needs.;Develop an individualized exercise prescription for aerobic and resistive training based on initial evaluation findings, risk stratification, comorbidities and participant's personal goals.  (Pended)   Provide advice, education, support and counseling about physical activity/exercise needs.;Develop an individualized exercise prescription for aerobic and resistive  training based on initial evaluation findings, risk stratification, comorbidities and participant's personal goals.  no documentation   Expected Outcomes  Short Term: Attend rehab on a regular basis to increase amount of physical activity.;Long Term: Exercising regularly at least 3-5 days a week.;Long Term: Add in home exercise to make exercise part of routine and to increase amount of physical activity.  (Pended)   Short Term: Attend rehab on a regular basis to increase amount of physical activity.;Long Term: Exercising regularly at least 3-5 days a week.;Long Term: Add in home exercise to make exercise part of routine and to increase amount of physical activity.  no documentation   Increase Strength and Stamina  Yes  (Pended)  Be able to walk 1.5 miles without difficulty or SOB  Yes  no documentation Be able to walk 1.5 miles without SOB or extreme fatigue/discomfort   Intervention  Provide advice, education, support and counseling about physical activity/exercise needs.;Develop an individualized exercise prescription for aerobic and resistive training based on initial evaluation findings, risk stratification, comorbidities and participant's personal goals.  (Pended)   Provide advice, education, support and counseling about physical activity/exercise needs.;Develop an individualized exercise prescription for aerobic and resistive training based on initial evaluation findings, risk stratification, comorbidities and participant's personal goals.  no documentation   Expected Outcomes  Short Term: Increase workloads from initial exercise prescription for resistance, speed, and METs.;Short Term: Perform resistance training exercises routinely during rehab and add in resistance training at home;Long Term: Improve cardiorespiratory fitness, muscular endurance and strength as measured by increased METs and functional capacity ( )  (Pended)   Short Term: Increase workloads from initial exercise prescription for  resistance, speed, and METs.;Short Term: Perform resistance training exercises routinely during rehab and add in resistance training at home;Long Term: Improve cardiorespiratory fitness, muscular endurance and strength as measured by increased METs and functional capacity ( )  no documentation   Able to understand and use rate of perceived exertion (RPE) scale  Yes  (Pended)   Yes  no documentation   Intervention  Provide education and explanation on how to use RPE scale  (Pended)   Provide education and explanation on how to use RPE scale  no documentation   Expected Outcomes  Short Term: Able to use RPE daily in rehab to express subjective intensity level;Long Term:  Able to use RPE to guide intensity level when exercising independently  (Pended)   Short Term: Able to use RPE daily in rehab to express subjective intensity level;Long Term:  Able to use RPE to guide intensity level when exercising independently  no documentation   Able to understand and use Dyspnea scale  Yes  (Pended)   Yes  no documentation   Intervention  Provide education and explanation on how to use Dyspnea scale  (Pended)   Provide education and explanation on how to use Dyspnea scale  no documentation   Expected Outcomes  Short Term: Able to use Dyspnea scale daily in rehab to express subjective sense of shortness of breath during exertion;Long Term:  Able to use Dyspnea scale to guide intensity level when exercising independently  (Pended)   Short Term: Able to use Dyspnea scale daily in rehab to express subjective sense of shortness of breath during exertion;Long Term: Able to use Dyspnea scale to guide intensity level when exercising independently  no documentation   Knowledge and understanding of Target Heart Rate Range (THRR)  Yes  (Pended)   Yes  no documentation   Intervention  Provide education and explanation of THRR including how the numbers were predicted and where they are located for reference  (Pended)   Provide  education and explanation of THRR including how the numbers were predicted and where they are located for reference  no documentation   Expected Outcomes  Long Term: Able to use THRR to govern intensity when exercising independently;Short Term: Able to state/look up THRR;Short Term: Able to use daily as guideline for intensity in rehab  (Pended)   Short Term: Able to state/look up THRR;Long Term: Able to use THRR to govern intensity when exercising independently;Short Term: Able to use daily as guideline for intensity in rehab  no documentation   Able to check pulse independently  Yes  (Pended)   Yes  no documentation   Intervention  Provide education and demonstration on how to check pulse in carotid and radial arteries.;Review the importance of being able to check your own pulse for safety during independent exercise  (Pended)   Provide education and demonstration on how to check pulse in carotid and radial arteries.;Review the importance of being able to check your own pulse for safety during independent exercise  no documentation   Expected Outcomes  Short Term: Able to explain why pulse checking is important during independent exercise;Long Term: Able to check pulse independently and accurately  (Pended)   Short Term: Able to explain why pulse checking is important during independent exercise;Long Term: Able to check pulse independently and accurately  no documentation   Understanding of Exercise Prescription  Yes  (Pended)   Yes  no documentation   Intervention  Provide education, explanation, and written materials on patient's individual exercise prescription  (Pended)   Provide education, explanation, and written materials on patient's individual exercise prescription  no documentation   Expected Outcomes  Short Term: Able to explain program exercise prescription;Long Term: Able to explain home exercise prescription to exercise independently  (Pended)   Short Term: Able to explain program exercise  prescription;Long Term: Able to explain home exercise prescription to exercise independently  no documentation          Exercise Goals Re-Evaluation : Exercise Goals Re-Evaluation    Exercise Goal Re-Evaluation    Row Name 09/16/17 1443 09/20/17 1518 10/20/17 1415 11/08/17 1620   Exercise Goals Review  Increase Physical Activity;Able to understand and use rate of perceived exertion (RPE) scale;Knowledge and understanding of Target Heart Rate Range (THRR);Understanding of Exercise Prescription;Increase Strength and Stamina;Able to check pulse independently  Increase Physical Activity;Able to understand and use rate of perceived exertion (RPE) scale;Knowledge and understanding of Target Heart Rate Range (THRR);Understanding of Exercise Prescription;Increase Strength and Stamina;Able to check pulse independently  Increase Physical Activity;Able to understand and use rate of perceived exertion (RPE) scale;Knowledge and understanding of Target Heart Rate Range (THRR);Understanding of Exercise Prescription;Increase Strength and Stamina;Able to check pulse independently  no documentation   Comments  Reviewed home exercise with pt today. Pt plans to go to the [facility] for exercise, 2x/week in addition to coming to cardiac rehab. Reviewed THR, pulse,  RPE, sign and symptoms, and when to call 911 or MD.  Also discussed weather considerations and indoor options.  Pt voiced understanding.  Reviewed home exercise with pt in which he plans to go to the [facility] for exercise, 2x/week in addition to coming to cardiac rehab.   Pt is able to walk for 20 minutes without pain or discomfort. Pt is steadily increasing activity levels at home and in cardiac rehab.  Pt is actively going to the [facility] on non cardiac rehab days in which he exercises for 30-45 minutes. Pt started working part-time on 11/07/17 and is excited about working and starting a new role.   Expected Outcomes  Pt will improve in cardirespiratory fitness and functional  capacity.   Pt will improve in cardirespiratory fitness and functional capacity.   Pt will improve in cardirespiratory fitness and functional capacity.   Pt will improve in cardirespiratory fitness and functional capacity.            Discharge Exercise Prescription (Final Exercise Prescription Changes): Exercise Prescription Changes - 11/11/17 1500    Response to Exercise          Blood Pressure (Admit)  106/62    Blood Pressure (Exercise)  144/64    Blood Pressure (Exit)  114/64    Heart Rate (Admit)  58 bpm    Heart Rate (Exercise)  108 bpm    Heart Rate (Exit)  81 bpm    Rating of Perceived Exertion (Exercise)  12    Symptoms  none    Duration  Continue with 30 min of aerobic exercise without signs/symptoms of physical distress.    Intensity  THRR unchanged        Progression          Progression  Continue to progress workloads to maintain intensity without signs/symptoms of physical distress.    Average METs  3.7        Resistance Training          Training Prescription  Yes    Weight  4lbs    Reps  10-15    Time  10 Minutes        Treadmill          MPH  2.9    Grade  2    Minutes  10    METs  4.02        NuStep          Level  6    SPM  80    Minutes  10    METs  3.4        Arm Ergometer          Level  3    Minutes  10        Home Exercise Plan          Plans to continue exercise at  Home (comment) walking    Frequency  Add 2 additional days to program exercise sessions.    Initial Home Exercises Provided  09/16/17           Nutrition:  Target Goals: Understanding of nutrition guidelines, daily intake of sodium 1500mg , cholesterol 200mg , calories 30% from fat and 7% or less from saturated fats, daily to have 5 or more servings of fruits and vegetables.  Biometrics: Pre Biometrics - 08/30/17 1131    Pre Biometrics          Height  5\' 10"  (1.778 m)    Weight  278 lb 10.6 oz (  126.4 kg)    Waist Circumference  52 inches    Hip  Circumference  51.5 inches    Waist to Hip Ratio  1.01 %    BMI (Calculated)  39.98    Triceps Skinfold  30 mm    % Body Fat  40.6 %    Grip Strength  32 kg    Flexibility  8 in    Single Leg Stand  3 seconds            Nutrition Therapy Plan and Nutrition Goals: Nutrition Therapy & Goals - 08/31/17 1605    Nutrition Therapy          Diet  Consistent Carb, Heart Healthy        Personal Nutrition Goals          Nutrition Goal  Pt to identify and limit food sources of saturated fat, trans fat, and sodium    Personal Goal #2  Pt to identify food quantities necessary to achieve weight loss of 6-24 lb (2.7-10.9 kg) at graduation from cardiac rehab. Goal wt of 235 lb desired.         Intervention Plan          Intervention  Prescribe, educate and counsel regarding individualized specific dietary modifications aiming towards targeted core components such as weight, hypertension, lipid management, diabetes, heart failure and other comorbidities.    Expected Outcomes  Short Term Goal: Understand basic principles of dietary content, such as calories, fat, sodium, cholesterol and nutrients.;Long Term Goal: Adherence to prescribed nutrition plan.           Nutrition Assessments: Nutrition Assessments - 08/31/17 1605    MEDFICTS Scores          Pre Score  51           Nutrition Goals Re-Evaluation:   Nutrition Goals Re-Evaluation:   Nutrition Goals Discharge (Final Nutrition Goals Re-Evaluation):   Psychosocial: Target Goals: Acknowledge presence or absence of significant depression and/or stress, maximize coping skills, provide positive support system. Participant is able to verbalize types and ability to use techniques and skills needed for reducing stress and depression.  Initial Review & Psychosocial Screening: Initial Psych Review & Screening - 08/31/17 0744    Initial Review          Current issues with  None Identified        Family Dynamics          Good  Support System?  Yes        Barriers          Psychosocial barriers to participate in program  There are no identifiable barriers or psychosocial needs.        Screening Interventions          Interventions  Encouraged to exercise           Quality of Life Scores: Quality of Life - 09/12/17 1700    Quality of Life Scores          Health/Function Pre  24.7 %    Socioeconomic Pre  24.58 %    Psych/Spiritual Pre  26.67 %    Family Pre  27.3 %    GLOBAL Pre  -- overall QOL scores very good. pt demonstrates positive outlook with good coping skills. pt eager to return to part time work.           Scores of 19 and below usually indicate a poorer quality of life in these areas.  A difference of  2-3 points is a clinically meaningful difference.  A difference of 2-3 points in the total score of the Quality of Life Index has been associated with significant improvement in overall quality of life, self-image, physical symptoms, and general health in studies assessing change in quality of life.  PHQ-9: Recent Review Flowsheet Data    Depression screen [facility] 2/9 09/07/2017   Decreased Interest 0   Down, Depressed, Hopeless 0   PHQ - 2 Score 0     Interpretation of Total Score  Total Score Depression Severity:  1-4 = Minimal depression, 5-9 = Mild depression, 10-14 = Moderate depression, 15-19 = Moderately severe depression, 20-27 = Severe depression   Psychosocial Evaluation and Intervention: Psychosocial Evaluation - 09/16/17 1535    Psychosocial Evaluation & Interventions          Interventions  Encouraged to exercise with the program and follow exercise prescription    Comments  no psychosocial needs identified, no interventions necessary.  pt would like to find part time job.     Expected Outcomes  pt will exhibit positive outlook with good coping skills.     Continue Psychosocial Services   No Follow up required           Psychosocial Re-Evaluation: Psychosocial  Re-Evaluation    Psychosocial Re-Evaluation    Row Name 10/11/17 0714 11/15/17 1356   Current issues with  None Identified  None Identified   Comments  pt encouraged to continue exercise with CR program. pt proactively seeking PT employment, with several promising leads. otherwise no psychosocial needs identified, no interventions necessary.   pt encouraged to continue exercise with CR program. No psychosocial needs identified, no interventions necessary.   pt has started PT job and feels able to continue CR working around his new schedule.    Expected Outcomes  pt will exhibit positive outlook with good coping skills.   pt will exhibit positive outlook with good coping skills.    Interventions  Encouraged to attend Cardiac Rehabilitation for the exercise  Encouraged to attend Cardiac Rehabilitation for the exercise   Continue Psychosocial Services   No Follow up required  No Follow up required          Psychosocial Discharge (Final Psychosocial Re-Evaluation): Psychosocial Re-Evaluation - 11/15/17 1356    Psychosocial Re-Evaluation          Current issues with  None Identified    Comments  pt encouraged to continue exercise with CR program. No psychosocial needs identified, no interventions necessary.   pt has started PT job and feels able to continue CR working around his new schedule.     Expected Outcomes  pt will exhibit positive outlook with good coping skills.     Interventions  Encouraged to attend Cardiac Rehabilitation for the exercise    Continue Psychosocial Services   No Follow up required           Vocational Rehabilitation: Provide vocational rehab assistance to qualifying candidates.   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 pt does not feel it will interfere with his ability to complete CR program.            Education: Education Goals: Education classes will be provided on a weekly basis, covering required topics. Participant will state understanding/return demonstration of  topics presented.  Learning Barriers/Preferences: Learning Barriers/Preferences - 08/30/17 0926    Learning Barriers/Preferences          Learning Barriers  Hearing;Sight    Learning Preferences  Skilled Demonstration;Written Material           Education Topics: Count Your Pulse:  -Group instruction provided by verbal instruction, demonstration, patient participation and written materials to support subject.  Instructors address importance of being able to find your pulse and how to count your pulse when at home without a heart monitor.  Patients get hands on experience counting their pulse with staff help and individually.   Heart Attack, Angina, and Risk Factor Modification:  -Group instruction provided by verbal instruction, video, and written materials to support subject.  Instructors address signs and symptoms of angina and heart attacks.    Also discuss risk factors for heart disease and how to make changes to improve heart health risk factors.   Functional Fitness:  -Group instruction provided by verbal instruction, demonstration, patient participation, and written materials to support subject.  Instructors address safety measures for doing things around the house.  Discuss how to get up and down off the floor, how to pick things up properly, how to safely get out of a chair without assistance, and balance training.   Meditation and Mindfulness:  -Group instruction provided by verbal instruction, patient participation, and written materials to support subject.  Instructor addresses importance of mindfulness and meditation practice to help reduce stress and improve awareness.  Instructor also leads participants through a meditation exercise.  Flowsheet Row CARDIAC REHAB PHASE II [facility]ERC[facility]  Date  10/26/17  Educator  Theda BelfastBob Hamilton  Instruction Review Code  2- Demonstrated Understanding      Stretching for Flexibility and Mobility:  -Group instruction provided by verbal instruction, patient participation, and written materials to support subject.  Instructors lead participants through series of stretches that are designed to increase flexibility thus improving mobility.  These stretches are additional exercise for major muscle groups that are typically performed during regular warm up and cool down.   Hands Only CPR:  -Group verbal, video, and participation provides a basic overview of AHA guidelines for community CPR. Role-play of emergencies allow participants the opportunity to practice calling for help and chest compression technique with discussion of AED use.   Hypertension: -Group verbal and written instruction that provides a basic overview of hypertension including the most recent diagnostic guidelines, risk factor reduction with self-care instructions and medication management. Flowsheet Row CARD[facility] RE[facility]AB [facility]  Date  10/28/17  Educator  RN  Instruction Review Code  2- Demonstrated Understanding       Nutrition I class: Heart Healthy Eating:  -Group instruction provided by PowerPoint slides, verbal discussion, and written materials to support subject matter. The instructor gives an explanation and review of the Therapeutic Lifestyle Changes diet recommendations, which includes a discussion on lipid goals, dietary fat, sodium, fiber, plant stanol/sterol esters, sugar, and the components of a well-balanced, healthy diet. Flowsheet Row CARDIAC REHAB PHASE II EXERC[facility]  Date  09/13/17  Educator  RD  Instruction Review Code  2- Demonstrated Understanding      Nutrition II class: Lifestyle  Skills:  -Group instruction provided by PowerPoint slides, verbal discussion, and written materials to support subject matter. The instructor gives an explanation and review  of label reading, grocery shopping for heart health, heart healthy recipe modifications, and ways to make healthier choices when eating out.   Diabetes Question & Answer:  -Group instruction provided by PowerPoint slides, verbal discussion, and written materials to support subject matter. The instructor gives an explanation and review of diabetes co-morbidities, pre- and post-prandial blood glucose goals, pre-exercise blood glucose goals, signs, symptoms, and treatment of hypoglycemia and hyperglycemia, and foot care basics.   Diabetes Blitz:  -Group instruction provided by PowerPoint slides, verbal discussion, and written materials to support subject matter. The instructor gives an explanation and review of the physiology behind type 1 and type 2 diabetes, diabetes medications and rational behind using different medications, pre- and post-prandial blood glucose recommendations and Hemoglobin A1c goals, diabetes diet, and exercise including blood glucose guidelines for exercising safely.    Portion Distortion:  -Group instruction provided by PowerPoint slides, verbal discussion, written materials, and food models to support subject matter. The instructor gives an explanation of serving size versus portion size, changes in portions sizes over the last 20 years, and what consists of a serving from each food group.   Stress Management:  -Group instruction provided by verbal instruction, video, and written materials to support subject matter.  Instructors review role of stress in heart disease and how to cope with stress positively.     Exercising on Your Own:  -Group instruction provided by verbal instruction, power point, and written materials to support subject.  Instructors discuss benefits of exercise, components of  exercise, frequency and intensity of exercise, and end points for exercise.  Also discuss use of nitroglycerin and activating EMS.  Review options of places to exercise outside of rehab.  Review guidelines for sex with heart disease. Flowsheet Row CARDIAC REHAB PHASE II EXERCISE from 10/28/2017 in [facility] CARDIAC REHAB  Date  10/05/17  Educator  EP  Instruction Review Code  2- Demonstrated Understanding      Cardiac Drugs I:  -Group instruction provided by verbal instruction and written materials to support subject.  Instructor reviews cardiac drug classes: antiplatelets, anticoagulants, beta blockers, and statins.  Instructor discusses reasons, side effects, and lifestyle considerations for each drug class. Flowsheet Row CARDIAC REHAB PHASE II EXERCISE from 10/28/2017 in [facility] CARDIAC REHAB  Date  10/19/17  Educator  Pharmacist  Instruction Review Code  2- Demonstrated Understanding      Cardiac Drugs II:  -Group instruction provided by verbal instruction and written materials to support subject.  Instructor reviews cardiac drug classes: angiotensin converting enzyme inhibitors (ACE-I), angiotensin II receptor blockers (ARBs), nitrates, and calcium channel blockers.  Instructor discusses reasons, side effects, and lifestyle considerations for each drug class.   Anatomy and Physiology of the Circulatory System:  Group verbal and written instruction and models provide basic cardiac anatomy and physiology, with the coronary electrical and arterial systems. Review of: AMI, Angina, Valve disease, Heart Failure, Peripheral Artery Disease, Cardiac Arrhythmia, Pacemakers, and the ICD. Flowsheet Row CARDIAC REHAB PHASE II EXERCISE from 10/28/2017 in [facility] CARDIAC REHAB  Date  09/14/17  Instruction Review Code  2- Demonstrated Understanding      Other Education:  -Group or individual verbal, written, or video instructions that  support the educational goals of the cardiac rehab program.   Holiday Eating Survival Tips:  -Group instruction provided by PowerPoint slides, verbal discussion, and written materials to support subject matter. The instructor gives patients tips, tricks, and techniques to help them not  only survive but enjoy the holidays despite the onslaught of food that accompanies the holidays.   Knowledge Questionnaire Score: Knowledge Questionnaire Score - 09/09/17 1552    Knowledge Questionnaire Score          Pre Score  23/24           Core Components/Risk Factors/Patient Goals at Admission: Personal Goals and Risk Factors at Admission - 08/30/17 1129    Core Components/Risk Factors/Patient Goals on Admission           Weight Management  Yes;Obesity    Intervention  Weight Management: Develop a combined nutrition and exercise program designed to reach desired caloric intake, while maintaining appropriate intake of nutrient and fiber, sodium and fats, and appropriate energy expenditure required for the weight goal.;Weight Management/Obesity: Establish reasonable short term and long term weight goals.;Weight Management: Provide education and appropriate resources to help participant work on and attain dietary goals.;Obesity: Provide education and appropriate resources to help participant work on and attain dietary goals.    Admit Weight  278 lb 10.6 oz (126.4 kg)    Goal Weight: Short Term  268 lb (121.6 kg)    Goal Weight: Long Term  235 lb (106.6 kg)    Expected Outcomes  Short Term: Continue to assess and modify interventions until short term weight is achieved;Long Term: Adherence to nutrition and physical activity/exercise program aimed toward attainment of established weight goal;Weight Loss: Understanding of general recommendations for a balanced deficit meal plan, which promotes 1-2 lb weight loss per week and includes a negative energy balance of [id number] kcal/d;Weight Maintenance:  Understanding of the daily nutrition guidelines, which includes 25-35% calories from fat, 7% or less cal from saturated fats, less than 200mg  cholesterol, less than 1.5gm of sodium, & 5 or more servings of fruits and vegetables daily;Understanding recommendations for meals to include 15-35% energy as protein, 25-35% energy from fat, 35-60% energy from carbohydrates, less than 200mg  of dietary cholesterol, 20-35 gm of total fiber daily;Understanding of distribution of calorie intake throughout the day with the consumption of 4-5 meals/snacks    Diabetes  Yes    Intervention  Provide education about signs/symptoms and action to take for hypo/hyperglycemia.;Provide education about proper nutrition, including hydration, and aerobic/resistive exercise prescription along with prescribed medications to achieve blood glucose in normal ranges: Fasting glucose 65-99 mg/dL    Expected Outcomes  Short Term: Participant verbalizes understanding of the signs/symptoms and immediate care of hyper/hypoglycemia, proper foot care and importance of medication, aerobic/resistive exercise and nutrition plan for blood glucose control.;Long Term: Attainment of HbA1C < 7%.    Hypertension  Yes    Intervention  Provide education on lifestyle modifcations including regular physical activity/exercise, weight management, moderate sodium restriction and increased consumption of fresh fruit, vegetables, and low fat dairy, alcohol moderation, and smoking cessation.;Monitor prescription use compliance.    Expected Outcomes  Short Term: Continued assessment and intervention until BP is < 140/31mm HG in hypertensive participants. < 130/81mm HG in hypertensive participants with diabetes, heart failure or chronic kidney disease.;Long Term: Maintenance of blood pressure at goal levels.    Lipids  Yes    Intervention  Provide education and support for participant on nutrition & aerobic/resistive exercise along with prescribed medications to  achieve LDL 70mg , HDL >40mg .    Expected Outcomes  Short Term: Participant states understanding of desired cholesterol values and is compliant with medications prescribed. Participant is following exercise prescription and nutrition guidelines.;Long Term: Cholesterol controlled with medications as prescribed, with individualized  exercise RX and with personalized nutrition plan. Value goals: LDL < 70mg , HDL > 40 mg.           Core Components/Risk Factors/Patient Goals Review:  Goals and Risk Factor Review    Core Components/Risk Factors/Patient Goals Review    Row Name 09/16/17 1530 10/11/17 0713 11/15/17 1355   Personal Goals Review  Weight Management/Obesity;Hypertension;Lipids  Weight Management/Obesity;Hypertension;Lipids  Weight Management/Obesity;Hypertension;Lipids   Review  pt with multiple CAD RF demonstrates eagerness to participate in CR program. pt reports improved chronic knee pain since beginning CR participation.   pt with multiple CAD RF demonstrates eagerness to participate in CR program. pt reports improved chronic knee pain since beginning CR participation thus increasing workloads accordingly.   pt with multiple CAD RF demonstrates eagerness to participate in CR program. pt pleased with increased functional ability.     Expected Outcomes  pt will participate in CR exercise,nutrition and lifestyle modification opportunities.   pt will participate in CR exercise,nutrition and lifestyle modification opportunities.   pt will participate in CR exercise,nutrition and lifestyle modification opportunities.           Core Components/Risk Factors/Patient Goals at Discharge (Final Review):  Goals and Risk Factor Review - 11/15/17 1355    Core Components/Risk Factors/Patient Goals Review          Personal Goals Review  Weight Management/Obesity;Hypertension;Lipids    Review  pt with multiple CAD RF demonstrates eagerness to participate in CR program. pt pleased with increased  functional ability.      Expected Outcomes  pt will participate in CR exercise,nutrition and lifestyle modification opportunities.            ITP Comments: ITP Comments    Row Name 08/30/17 1610 09/22/17 1122 10/11/17 0713 11/15/17 1354   ITP Comments  Dr. Armanda Magic, Medical Director  30 day ITP review. pt demonstrates willingness to participate in group exercise program.   30 day ITP review. pt demonstrates increased eagerness  to participate in group exercise program.   30 day ITP review. pt demonstrates increased eagerness  to participate in group exercise program. pt recent absences due to new work schedule.      Comments:

## 2017-11-18 ENCOUNTER — Encounter (HOSPITAL_COMMUNITY): Payer: No Typology Code available for payment source

## 2017-11-21 ENCOUNTER — Encounter (HOSPITAL_COMMUNITY): Payer: No Typology Code available for payment source

## 2017-11-23 ENCOUNTER — Encounter (HOSPITAL_COMMUNITY): Payer: No Typology Code available for payment source

## 2017-11-25 ENCOUNTER — Encounter (HOSPITAL_COMMUNITY): Payer: No Typology Code available for payment source

## 2017-11-28 ENCOUNTER — Encounter (HOSPITAL_COMMUNITY): Payer: No Typology Code available for payment source

## 2017-11-30 ENCOUNTER — Encounter (HOSPITAL_COMMUNITY): Payer: No Typology Code available for payment source

## 2017-12-02 ENCOUNTER — Encounter (HOSPITAL_COMMUNITY): Payer: No Typology Code available for payment source

## 2017-12-05 ENCOUNTER — Encounter (HOSPITAL_COMMUNITY): Payer: No Typology Code available for payment source

## 2017-12-06 NOTE — Progress Notes (Signed)
Discharge Progress Report  Patient Details  Name: Allen Alexander MRN: [id number] Date of Birth: [birth year] Referring Provider:   Flowsheet Row CARDIAC REHAB PHASE II ORIENTATION from 08/30/2017 in [facility]ARDI[facility]  Referring Provider  Humpreys,Holly MD ([facility]'s coverage)       Number of Visits: 30   Reason for Discharge:  Patient reached a stable level of exercise.  Smoking History:  Social History   Tobacco Use  Smoking Status Never Smoker  Smokeless Tobacco Never Used    Diagnosis:  S/P CABG x 4  ADL [facility]:   Initial Exercise Prescription: Initial Exercise Prescription - 08/30/17 1100    Date of Initial Exercise RX and Referring Provider          Date  08/30/17    Referring Provider  Humpreys,Holly MD ([facility]'s coverage)        Treadmill          MPH  2    Grade  0    Minutes  15    METs  2.53        NuStep          Level  2    SPM  75    Minutes  15    METs  2        Arm Ergometer          Level  2    Watts  15    Minutes  15    METs  1.6        Prescription Details          Frequency (times per week)  3    Duration  Progress to 30 minutes of continuous aerobic without signs/symptoms of physical distress        Intensity          THRR 40-80% of Max Heartrate  59-118    Ratings of Perceived Exertion  11-15    Perceived Dyspnea  0-4        Progression          Progression  Continue to progress workloads to maintain intensity without signs/symptoms of physical distress.        Resistance Training          Training Prescription  Yes    Weight  3lbs    Reps  10-15           Discharge Exercise Prescription (Final Exercise Prescription Changes): Exercise Prescription Changes - 11/16/17 1530    Response to Exercise          Blood Pressure (Admit)  124/72    Blood Pressure (Exercise)  128/80    Blood Pressure (Exit)  104/70    Heart Rate (Admit)  72 bpm    Heart Rate (Exercise)  95 bpm    Heart  Rate (Exit)  45 bpm    Rating of Perceived Exertion (Exercise)  12    Symptoms  none    Comments  pt performed 6 minute walk test and post measurements with EP    Duration  Continue with 30 min of aerobic exercise without signs/symptoms of physical distress.    Intensity  THRR unchanged        Progression          Progression  Continue to progress workloads to maintain intensity without signs/symptoms of physical distress.        Resistance Training          Training Prescription  No  relaxation day   Time  10 Minutes        Arm Ergometer          Level  10    Minutes  10        Home Exercise Plan          Plans to continue exercise at  Home (comment)   walking   Frequency  Add 2 additional days to program exercise sessions.    Initial Home Exercises Provided  09/16/17           Functional Capacity: 6 Minute Walk    6 Minute Walk    Row Name 08/30/17 1137 11/21/17 1715 11/30/17 1640   Phase  Initial  Discharge  no documentation   Distance  1319 feet  1436 feet  no documentation   Distance % Change  no documentation  8.87 %  no documentation   Distance Feet Change  no documentation  117 ft  no documentation   Walk Time  6 minutes  6 minutes  no documentation   # of Rest Breaks  0  0  no documentation   MPH  2.5  2.7  no documentation   METS  1.95  2.3  no documentation   RPE  9  7  no documentation   VO2 Peak  6.8  7.9  no documentation   Symptoms  No  No  no documentation   Resting HR  71 bpm  72 bpm  no documentation   Resting BP  104/60  124/72  no documentation   Resting Oxygen Saturation   96 %  no documentation  no documentation   Exercise Oxygen Saturation  during 6 min walk  93 %  no documentation  no documentation   Max Ex. HR  92 bpm  95 bpm  no documentation   Max Ex. BP  122/60  128/80  no documentation   2 Minute Post BP  100/60  no documentation  104/70          Psychological, QOL, Others - Outcomes: PHQ 2/9: Depression [facility]  12/06/2017 09/07/2017  Decreased Interest 0 0  Down, Depressed, Hopeless 0 0  PHQ - 2 Score 0 0    Quality of Life: Quality of Life - 11/21/17 1654    Quality of Life          Select  Quality of Life        Quality of Life Scores          Health/Function Pre  24.7 %    Health/Function Post  25.11 %    Health/Function % Change  1.66 %    Socioeconomic Pre  24.58 %    Socioeconomic Post  25.57 %    Socioeconomic % Change   4.03 %    Psych/Spiritual Pre  26.67 %    Psych/Spiritual Post  24.07 %    Psych/Spiritual % Change  -9.75 %    Family Post  30 %    GLOBAL Post  25.59 %           Personal Goals: Goals established at orientation with interventions provided to work toward goal. Personal Goals and Risk Factors at Admission - 08/30/17 1129    Core Components/Risk Factors/Patient Goals on Admission           Weight Management  Yes;Obesity    Intervention  Weight Management: Develop a combined nutrition and exercise program designed to reach desired caloric intake,  while maintaining appropriate intake of nutrient and fiber, sodium and fats, and appropriate energy expenditure required for the weight goal.;Weight Management/Obesity: Establish reasonable short term and long term weight goals.;Weight Management: Provide education and appropriate resources to help participant work on and attain dietary goals.;Obesity: Provide education and appropriate resources to help participant work on and attain dietary goals.    Admit Weight  278 lb 10.6 oz (126.4 kg)    Goal Weight: Short Term  268 lb (121.6 kg)    Goal Weight: Long Term  235 lb (106.6 kg)    Expected Outcomes  Short Term: Continue to assess and modify interventions until short term weight is achieved;Long Term: Adherence to nutrition and physical activity/exercise program aimed toward attainment of established weight goal;Weight Loss: Understanding of general recommendations for a balanced deficit meal plan, which promotes 1-2 lb  weight loss per week and includes a negative energy balance of [phone] kcal/d;Weight Maintenance: Understanding of the daily nutrition guidelines, which includes 25-35% calories from fat, 7% or less cal from saturated fats, less than 200mg  cholesterol, less than 1.5gm of sodium, & 5 or more servings of fruits and vegetables daily;Understanding recommendations for meals to include 15-35% energy as protein, 25-35% energy from fat, 35-60% energy from carbohydrates, less than 200mg  of dietary cholesterol, 20-35 gm of total fiber daily;Understanding of distribution of calorie intake throughout the day with the consumption of 4-5 meals/snacks    Diabetes  Yes    Intervention  Provide education about signs/symptoms and action to take for hypo/hyperglycemia.;Provide education about proper nutrition, including hydration, and aerobic/resistive exercise prescription along with prescribed medications to achieve blood glucose in normal ranges: Fasting glucose 65-99 mg/dL    Expected Outcomes  Short Term: Participant verbalizes understanding of the signs/symptoms and immediate care of hyper/hypoglycemia, proper foot care and importance of medication, aerobic/resistive exercise and nutrition plan for blood glucose control.;Long Term: Attainment of HbA1C < 7%.    Hypertension  Yes    Intervention  Provide education on lifestyle modifcations including regular physical activity/exercise, weight management, moderate sodium restriction and increased consumption of fresh fruit, vegetables, and low fat dairy, alcohol moderation, and smoking cessation.;Monitor prescription use compliance.    Expected Outcomes  Short Term: Continued assessment and intervention until BP is < 140/96mm HG in hypertensive participants. < 130/42mm HG in hypertensive participants with diabetes, heart failure or chronic kidney disease.;Long Term: Maintenance of blood pressure at goal levels.    Lipids  Yes    Intervention  Provide education and support  for participant on nutrition & aerobic/resistive exercise along with prescribed medications to achieve LDL 70mg , HDL >40mg .    Expected Outcomes  Short Term: Participant states understanding of desired cholesterol values and is compliant with medications prescribed. Participant is following exercise prescription and nutrition guidelines.;Long Term: Cholesterol controlled with medications as prescribed, with individualized exercise RX and with personalized nutrition plan. Value goals: LDL < 70mg , HDL > 40 mg.            Personal Goals Discharge: Goals and Risk Factor Review    Core Components/Risk Factors/Patient Goals Review    Row Name 09/16/17 1530 10/11/17 0713 11/15/17 1355 12/06/17 0819   Personal Goals Review  Weight Management/Obesity;Hypertension;Lipids  Weight Management/Obesity;Hypertension;Lipids  Weight Management/Obesity;Hypertension;Lipids  Weight Management/Obesity;Hypertension;Lipids   Review  pt with multiple CAD RF demonstrates eagerness to participate in CR program. pt reports improved chronic knee pain since beginning CR participation.   pt with multiple CAD RF demonstrates eagerness to participate in CR program. pt  reports improved chronic knee pain since beginning CR participation thus increasing workloads accordingly.   pt with multiple CAD RF demonstrates eagerness to participate in CR program. pt pleased with increased functional ability.    pt completed CR program.  pt plans to continue working towards lifestyle modification to reduce RF. pt pleased with his increased functional ability especially to be able to return to part time employment.  pt plans to continue exercising in cardiac maintenance program.    Expected Outcomes  pt will participate in CR exercise,nutrition and lifestyle modification opportunities.   pt will participate in CR exercise,nutrition and lifestyle modification opportunities.   pt will participate in CR exercise,nutrition and lifestyle modification  opportunities.   pt will participate in CR exercise,nutrition and lifestyle modification opportunities.           Exercise Goals and Review: Exercise Goals    Exercise Goals    Row Name 08/30/17 1914 08/30/17 1132 08/30/17 1134   Increase Physical Activity  Yes  (Pended)   Yes  no documentation   Intervention  Provide advice, education, support and counseling about physical activity/exercise needs.;Develop an individualized exercise prescription for aerobic and resistive training based on initial evaluation findings, risk stratification, comorbidities and participant's personal goals.  (Pended)   Provide advice, education, support and counseling about physical activity/exercise needs.;Develop an individualized exercise prescription for aerobic and resistive training based on initial evaluation findings, risk stratification, comorbidities and participant's personal goals.  no documentation   Expected Outcomes  Short Term: Attend rehab on a regular basis to increase amount of physical activity.;Long Term: Exercising regularly at least 3-5 days a week.;Long Term: Add in home exercise to make exercise part of routine and to increase amount of physical activity.  (Pended)   Short Term: Attend rehab on a regular basis to increase amount of physical activity.;Long Term: Exercising regularly at least 3-5 days a week.;Long Term: Add in home exercise to make exercise part of routine and to increase amount of physical activity.  no documentation   Increase Strength and Stamina  Yes  (Pended)  Be able to walk 1.5 miles without difficulty or SOB  Yes  (comments only) Be able to walk 1.5 miles without SOB or extreme fatigue/discomfort   Intervention  Provide advice, education, support and counseling about physical activity/exercise needs.;Develop an individualized exercise prescription for aerobic and resistive training based on initial evaluation findings, risk stratification, comorbidities and participant's  personal goals.  (Pended)   Provide advice, education, support and counseling about physical activity/exercise needs.;Develop an individualized exercise prescription for aerobic and resistive training based on initial evaluation findings, risk stratification, comorbidities and participant's personal goals.  no documentation   Expected Outcomes  Short Term: Increase workloads from initial exercise prescription for resistance, speed, and METs.;Short Term: Perform resistance training exercises routinely during rehab and add in resistance training at home;Long Term: Improve cardiorespiratory fitness, muscular endurance and strength as measured by increased METs and functional capacity ( )  (Pended)   Short Term: Increase workloads from initial exercise prescription for resistance, speed, and METs.;Short Term: Perform resistance training exercises routinely during rehab and add in resistance training at home;Long Term: Improve cardiorespiratory fitness, muscular endurance and strength as measured by increased METs and functional capacity ( )  no documentation   Able to understand and use rate of perceived exertion (RPE) scale  Yes  (Pended)   Yes  no documentation   Intervention  Provide education and explanation on how to use RPE scale  (Pended)  Provide education and explanation on how to use RPE scale  no documentation   Expected Outcomes  Short Term: Able to use RPE daily in rehab to express subjective intensity level;Long Term:  Able to use RPE to guide intensity level when exercising independently  (Pended)   Short Term: Able to use RPE daily in rehab to express subjective intensity level;Long Term:  Able to use RPE to guide intensity level when exercising independently  no documentation   Able to understand and use Dyspnea scale  Yes  (Pended)   Yes  no documentation   Intervention  Provide education and explanation on how to use Dyspnea scale  (Pended)   Provide education and explanation on how to  use Dyspnea scale  no documentation   Expected Outcomes  Short Term: Able to use Dyspnea scale daily in rehab to express subjective sense of shortness of breath during exertion;Long Term: Able to use Dyspnea scale to guide intensity level when exercising independently  (Pended)   Short Term: Able to use Dyspnea scale daily in rehab to express subjective sense of shortness of breath during exertion;Long Term: Able to use Dyspnea scale to guide intensity level when exercising independently  no documentation   Knowledge and understanding of Target Heart Rate Range (THRR)  Yes  (Pended)   Yes  no documentation   Intervention  Provide education and explanation of THRR including how the numbers were predicted and where they are located for reference  (Pended)   Provide education and explanation of THRR including how the numbers were predicted and where they are located for reference  no documentation   Expected Outcomes  Long Term: Able to use THRR to govern intensity when exercising independently;Short Term: Able to state/look up THRR;Short Term: Able to use daily as guideline for intensity in rehab  (Pended)   Short Term: Able to state/look up THRR;Long Term: Able to use THRR to govern intensity when exercising independently;Short Term: Able to use daily as guideline for intensity in rehab  no documentation   Able to check pulse independently  Yes  (Pended)   Yes  no documentation   Intervention  Provide education and demonstration on how to check pulse in carotid and radial arteries.;Review the importance of being able to check your own pulse for safety during independent exercise  (Pended)   Provide education and demonstration on how to check pulse in carotid and radial arteries.;Review the importance of being able to check your own pulse for safety during independent exercise  no documentation   Expected Outcomes  Short Term: Able to explain why pulse checking is important during independent exercise;Long  Term: Able to check pulse independently and accurately  (Pended)   Short Term: Able to explain why pulse checking is important during independent exercise;Long Term: Able to check pulse independently and accurately  no documentation   Understanding of Exercise Prescription  Yes  (Pended)   Yes  no documentation   Intervention  Provide education, explanation, and written materials on patient's individual exercise prescription  (Pended)   Provide education, explanation, and written materials on patient's individual exercise prescription  no documentation   Expected Outcomes  Short Term: Able to explain program exercise prescription;Long Term: Able to explain home exercise prescription to exercise independently  (Pended)   Short Term: Able to explain program exercise prescription;Long Term: Able to explain home exercise prescription to exercise independently  no documentation          Nutrition & Weight - Outcomes:  Pre Biometrics - 08/30/17 1131    Pre Biometrics          Height  5\' 10"  (1.778 m)    Weight  126.4 kg    Waist Circumference  52 inches    Hip Circumference  51.5 inches    Waist to Hip Ratio  1.01 %    BMI (Calculated)  39.98    Triceps Skinfold  30 mm    % Body Fat  40.6 %    Grip Strength  32 kg    Flexibility  8 in    Single Leg Stand  3 seconds          Post Biometrics - 11/30/17 1641     Post  Biometrics          Height  5\' 10"  (1.778 m)    Weight  123.8 kg    Waist Circumference  45 inches    Hip Circumference  49.5 inches    Waist to Hip Ratio  0.91 %    BMI (Calculated)  39.16    Triceps Skinfold  31 mm    % Body Fat  37.1 %    Grip Strength  40.5 kg    Flexibility  10 in    Single Leg Stand  5 seconds           Nutrition: Nutrition Therapy & Goals - 08/31/17 1605    Nutrition Therapy          Diet  Consistent Carb, Heart Healthy        Personal Nutrition Goals          Nutrition Goal  Pt to identify and limit food sources of saturated fat,  trans fat, and sodium    Personal Goal #2  Pt to identify food quantities necessary to achieve weight loss of 6-24 lb (2.7-10.9 kg) at graduation from cardiac rehab. Goal wt of 235 lb desired.         Intervention Plan          Intervention  Prescribe, educate and counsel regarding individualized specific dietary modifications aiming towards targeted core components such as weight, hypertension, lipid management, diabetes, heart failure and other comorbidities.    Expected Outcomes  Short Term Goal: Understand basic principles of dietary content, such as calories, fat, sodium, cholesterol and nutrients.;Long Term Goal: Adherence to prescribed nutrition plan.           Nutrition Discharge: Nutrition Assessments - 12/01/17 0807    MEDFICTS Scores          Pre Score  51    Post Score  32    Score Difference  -19           Education Questionnaire Score: Knowledge Questionnaire Score - 11/21/17 1654    Knowledge Questionnaire Score          Post Score  24/24           Goals reviewed with patient; copy given to patient.

## 2017-12-06 NOTE — Addendum Note (Signed)
Encounter addended by: Robyne Peersion, Brie Eppard H, RN on: 12/06/2017 8:23 AM  Actions taken: Order Reconciliation Section accessed, Flowsheet data copied forward, Visit Navigator Flowsheet section accepted, Sign clinical note, Episode resolved

## 2017-12-07 ENCOUNTER — Encounter (HOSPITAL_COMMUNITY): Payer: No Typology Code available for payment source

## 2017-12-09 ENCOUNTER — Encounter (HOSPITAL_COMMUNITY): Payer: No Typology Code available for payment source

## 2017-12-16 ENCOUNTER — Encounter: Payer: Self-pay | Admitting: Podiatry

## 2017-12-16 ENCOUNTER — Ambulatory Visit (INDEPENDENT_AMBULATORY_CARE_PROVIDER_SITE_OTHER): Payer: No Typology Code available for payment source | Admitting: Podiatry

## 2017-12-16 DIAGNOSIS — E1149 Type 2 diabetes mellitus with other diabetic neurological complication: Secondary | ICD-10-CM | POA: Diagnosis not present

## 2017-12-16 DIAGNOSIS — M79675 Pain in left toe(s): Secondary | ICD-10-CM

## 2017-12-16 DIAGNOSIS — M79674 Pain in right toe(s): Secondary | ICD-10-CM

## 2017-12-16 DIAGNOSIS — B351 Tinea unguium: Secondary | ICD-10-CM | POA: Diagnosis not present

## 2017-12-17 NOTE — Progress Notes (Signed)
Subjective: 72 y.o. returns the office today for painful, elongated, thickened toenails which he cannot trim himself. Denies any redness or drainage around the nails.  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  He states is not bad enough to have surgery or any other treatment for it.  He tries to change shoes to help take pressure off the area.  Denies any acute changes since last appointment and no new complaints today. Denies any systemic complaints such as fevers, chills, nausea, vomiting.   PCP: Allen Amen, MD  Objective: AAO 3, NAD DP/PT pulses palpable, CRT less than 3 seconds Sensation decreased with Simms Weinstein monofilament. Nails hypertrophic, dystrophic, elongated, brittle, discolored 10. There is tenderness overlying the nails 1-5 bilaterally. There is no surrounding erythema or drainage along the nail sites. Significant arthritic changes present left first MPJ.  Minimal erythema on the area of the bony exostosis but there is no skin breakdown or signs of infection. No open lesions or pre-ulcerative lesions are identified. No other areas of tenderness bilateral lower extremities. No overlying edema, erythema, increased warmth. No pain with calf compression, swelling, warmth, erythema.  Assessment: Patient presents with symptomatic onychomycosis  Plan: -Treatment options including alternatives, risks, complications were discussed -Nails sharply debrided 10 without complication/bleeding. 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  Discussed in regards to the left foot continue to offload the area as well as change in shoes.  He does not feel he needs other treatment at this point. -Discussed  daily foot inspection. If there are any changes, to call the office immediately.  -Follow-up in 3 months or sooner if any problems are to arise. In the meantime, encouraged to call the office with any questions, concerns, changes symptoms.  Allen Alexander, DPM

## 2018-03-23 ENCOUNTER — Ambulatory Visit: Payer: Non-veteran care | Admitting: Podiatry

## 2018-03-24 ENCOUNTER — Ambulatory Visit: Payer: Non-veteran care | Admitting: Podiatry

## 2018-03-24 ENCOUNTER — Encounter: Payer: Non-veteran care | Admitting: Podiatry

## 2018-03-24 NOTE — Patient Instructions (Signed)
Diabetes and Foot Care Diabetes may cause you to have problems because of poor blood supply (circulation) to your feet and legs. This may cause the skin on your feet to become thinner, break easier, and heal more slowly. Your skin may become dry, and the skin may peel and crack. You may also have nerve damage in your legs and feet causing decreased feeling in them. You may not notice minor injuries to your feet that could lead to infections or more serious problems. Taking care of your feet is one of the most important things you can do for yourself. Follow these instructions at home:  Wear shoes at all times, even in the house. Do not go barefoot. Bare feet are easily injured.  Check your feet daily for blisters, cuts, and redness. If you cannot see the bottom of your feet, use a mirror or ask someone for help.  Wash your feet with warm water (do not use hot water) and mild soap. Then pat your feet and the areas between your toes until they are completely dry. Do not soak your feet as this can dry your skin.  Apply a moisturizing lotion or petroleum jelly (that does not contain alcohol and is unscented) to the skin on your feet and to dry, brittle toenails. Do not apply lotion between your toes.  Trim your toenails straight across. Do not dig under them or around the cuticle. File the edges of your nails with an emery board or nail file.  Do not cut corns or calluses or try to remove them with medicine.  Wear clean socks or stockings every day. Make sure they are not too tight. Do not wear knee-high stockings since they may decrease blood flow to your legs.  Wear shoes that fit properly and have enough cushioning. To break in new shoes, wear them for just a few hours a day. This prevents you from injuring your feet. Always look in your shoes before you put them on to be sure there are no objects inside.  Do not cross your legs. This may decrease the blood flow to your feet.  If you find a  minor scrape, cut, or break in the skin on your feet, keep it and the skin around it clean and dry. These areas may be cleansed with mild soap and water. Do not cleanse the area with peroxide, alcohol, or iodine.  When you remove an adhesive bandage, be sure not to damage the skin around it.  If you have a wound, look at it several times a day to make sure it is healing.  Do not use heating pads or hot water bottles. They may burn your skin. If you have lost feeling in your feet or legs, you may not know it is happening until it is too late.  Make sure your health care provider performs a complete foot exam at least annually or more often if you have foot problems. Report any cuts, sores, or bruises to your health care provider immediately. Contact a health care provider if:  You have an injury that is not healing.  You have cuts or breaks in the skin.  You have an ingrown nail.  You notice redness on your legs or feet.  You feel burning or tingling in your legs or feet.  You have pain or cramps in your legs and feet.  Your legs or feet are numb.  Your feet always feel cold. Get help right away if:  There is increasing   redness, swelling, or pain in or around a wound.  There is a red line that goes up your leg.  Pus is coming from a wound.  You develop a fever or as directed by your health care provider.  You notice a bad smell coming from an ulcer or wound. This information is not intended to replace advice given to you by your health care provider. Make sure you discuss any questions you have with your health care provider. Document Released: 03/26/2000 Document Revised: 09/04/2015 Document Reviewed: 09/05/2012 Elsevier Interactive Patient Education  2017 [employer] Onychomycosis/Fungal Toenails  WHAT IS IT? An infection that lies within the keratin of your nail plate that is caused by a fungus.  WHY ME? Fungal infections affect all ages, sexes, races, and creeds.   There may be many factors that predispose you to a fungal infection such as age, coexisting medical conditions such as diabetes, or an autoimmune disease; stress, medications, fatigue, genetics, etc.  Bottom line: fungus thrives in a warm, moist environment and your shoes offer such a location.  IS IT CONTAGIOUS? Theoretically, yes.  You do not want to share shoes, nail clippers or files with someone who has fungal toenails.  Walking around barefoot in the same room or sleeping in the same bed is unlikely to transfer the organism.  It is important to realize, however, that fungus can spread easily from one nail to the next on the same foot.  HOW DO WE TREAT THIS?  There are several ways to treat this condition.  Treatment may depend on many factors such as age, medications, pregnancy, liver and kidney conditions, etc.  It is best to ask your doctor which options are available to you.  1. No treatment.   Unlike many other medical concerns, you can live with this condition.  However for many people this can be a painful condition and may lead to ingrown toenails or a bacterial infection.  It is recommended that you keep the nails cut short to help reduce the amount of fungal nail. 2. Topical treatment.  These range from herbal remedies to prescription strength nail lacquers.  About 40-50% effective, topicals require twice daily application for approximately 9 to 12 months or until an entirely new nail has grown out.  The most effective topicals are medical grade medications available through physicians offices. 3. Oral antifungal medications.  With an 80-90% cure rate, the most common oral medication requires 3 to 4 months of therapy and stays in your system for a year as the new nail grows out.  Oral antifungal medications do require blood work to make sure it is a safe drug for you.  A liver function panel will be performed prior to starting the medication and after the first month of treatment.  It is  important to have the blood work performed to avoid any harmful side effects.  In general, this medication safe but blood work is required. 4. Laser Therapy.  This treatment is performed by applying a specialized laser to the affected nail plate.  This therapy is noninvasive, fast, and non-painful.  It is not covered by insurance and is therefore, out of pocket.  The results have been very good with a 80-95% cure rate.  [facility] is the only practice in the area to offer this therapy. 5. Permanent Nail Avulsion.  Removing the entire nail so that a new nail will not grow back. 

## 2018-09-05 ENCOUNTER — Encounter (HOSPITAL_COMMUNITY): Payer: Self-pay | Admitting: Family Medicine

## 2018-09-05 ENCOUNTER — Other Ambulatory Visit: Payer: Self-pay

## 2018-09-05 ENCOUNTER — Ambulatory Visit (HOSPITAL_COMMUNITY)
Admission: EM | Admit: 2018-09-05 | Discharge: 2018-09-05 | Disposition: A | Discharge status: Home / Self Care | Payer: Medicare HMO | Attending: Family Medicine | Admitting: Family Medicine

## 2018-09-05 DIAGNOSIS — S91114A Laceration without foreign body of right lesser toe(s) without damage to nail, initial encounter: Secondary | ICD-10-CM

## 2018-09-05 DIAGNOSIS — L03116 Cellulitis of left lower limb: Secondary | ICD-10-CM | POA: Diagnosis not present

## 2018-09-05 DIAGNOSIS — W260XXA Contact with knife, initial encounter: Secondary | ICD-10-CM | POA: Diagnosis not present

## 2018-09-05 DIAGNOSIS — E1140 Type 2 diabetes mellitus with diabetic neuropathy, unspecified: Secondary | ICD-10-CM

## 2018-09-05 DIAGNOSIS — Z951 Presence of aortocoronary bypass graft: Secondary | ICD-10-CM

## 2018-09-05 DIAGNOSIS — Z23 Encounter for immunization: Secondary | ICD-10-CM

## 2018-09-05 DIAGNOSIS — E785 Hyperlipidemia, unspecified: Secondary | ICD-10-CM

## 2018-09-05 MED ORDER — DOXYCYCLINE HYCLATE 100 MG PO TABS: 100.0000 mg | ORAL_TABLET | Freq: Two times a day (BID) | ORAL | 0 refills | Status: AC

## 2018-09-05 MED ORDER — TETANUS-DIPHTH-ACELL PERTUSSIS 5-2.5-18.5 LF-MCG/0.5 IM SUSP
INTRAMUSCULAR | Status: AC
  Filled 2018-09-05: qty 0.5

## 2018-09-05 MED ORDER — TETANUS-DIPHTH-ACELL PERTUSSIS 5-2.5-18.5 LF-MCG/0.5 IM SUSP
0.5000 mL | Freq: Once | INTRAMUSCULAR | Status: AC
  Administered 2018-09-05: 20:00:00 0.5 mL via INTRAMUSCULAR

## 2018-09-05 MED ORDER — METFORMIN HCL ER 500 MG PO TB24: 2000.0000 mg | ORAL_TABLET | Freq: Every day | ORAL | 11 refills | Status: AC

## 2018-09-05 NOTE — ED Triage Notes (Signed)
A knife fell off counter yesterday and cut left little toe.  With patient's health issues, and toe is red, swollen and slightly painful per patient-patient is concerned for infection

## 2018-09-05 NOTE — Discharge Instructions (Signed)
I need to have you come back for a recheck in three days to make sure the toe is healing properly.

## 2018-09-05 NOTE — ED Provider Notes (Signed)
[facility]    CSN: [id number] Arrival date & time: 09/05/18  1927     History   Chief Complaint Chief Complaint  Patient presents with  . Wound Infection    HPI Vennie HomansChester Axford is a 73 y.o. male.   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  He dropped a knife on his left small toe last night and it "blood like hell" and he is worried that it may be getting infected because it is gotten redder.  He does have diabetic neuropathy so he does not have a lot of pain.  He has a h/o OSA, hypertension and 20 pack year smoking hx.     Past Medical History:  Diagnosis Date  . Diabetes mellitus without complication [facility](HCC)     Patient Active Problem List   Diagnosis Date Noted  . HLD (hyperlipidemia) 09/05/2018  . Diabetic neuropathy (HCC) 09/05/2018  . 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 Coronary artery disease involving native coronary artery 01/13/2017  . Essential hypertension 01/13/2017  . OSA (obstructive sleep apnea) 01/13/2017  . Type 2 diabetes mellitus (HCC) 01/13/2017    Past Surgical History:  Procedure Laterality Date  . CARDIAC SURGERY         Home Medications    Prior to Admission medications   Medication Sig Start Date End Date Taking? Authorizing Provider  aspirin 81 MG chewable tablet Chew 162 mg by mouth daily.    [provider]  atorvastatin (LIPITOR) 40 MG tablet Take 40 mg by mouth daily.    [provider]  Cholecalciferol (VITAMIN D) 2000 units CAPS Take 2,000 Units by mouth every morning.    [provider]  diphenhydrAMINE (BENADRYL) 25 MG tablet Take 25 mg by mouth every 6 (six) hours as needed.    [provider]  doxycycline (VIBRA-TABS) 100 MG tablet Take 1 tablet (100 mg total) by mouth 2 (two) times daily. 09/05/18   Elvina SidleLauenstein, Jeannia Tatro, MD  insulin aspart (NOVOLOG) 100 UNIT/ML injection Inject 2-10  Units into the skin 3 (three) times daily before meals. Sliding scale. 151-200=2 units. 201-250=4 units.  251-300= 6 units.  351-400=10 units. Greater than 400; call MD    [provider]  insulin glargine (LANTUS) 100 UNIT/ML injection Inject 25 Units into the skin at bedtime.     [provider]  Melatonin 10 MG TABS Take 1 tablet by mouth at bedtime as needed.    [provider]  metFORMIN (GLUCOPHAGE-XR) 500 MG 24 hr tablet Take 4 tablets (2,000 mg total) by mouth daily with breakfast. 09/05/18   Elvina SidleLauenstein, Haliegh Khurana, MD  metoprolol tartrate (LOPRESSOR) 25 MG tablet Take 12.5 mg by mouth 2 (two) times daily.     [provider]  Multiple Vitamins-Minerals (MULTIVITAMIN WITH MINERALS) tablet Take 1 tablet by mouth daily.    [provider]  niacin 100 MG tablet Take 100 mg by mouth at bedtime.    [provider]    Family History No family history on file.  Social History Social History   Tobacco Use  . Smoking status: Never Smoker  . Smokeless tobacco: Never Used  Substance Use Topics  . Alcohol use: Not Currently  . Drug use: Never     Allergies   Hydrochlorothiazide; Lisinopril; and Niacin   Review of Systems Review of Systems  All other systems reviewed and are  negative.    Physical Exam Triage Vital Signs ED Triage Vitals  Enc Vitals Group     BP      Pulse      Resp      Temp      Temp src      SpO2      Weight      Height      Head Circumference      Peak Flow      Pain Score      Pain Loc      Pain Edu?      Excl. in GC?    No data found.  Updated Vital Signs BP (!) 162/78 (BP Location: Right Arm) Comment (BP Location): large cuff  Pulse 82   Temp 97.6 F (36.4 C) (Oral)   Resp 20   SpO2 97%    Physical Exam Vitals signs and nursing note reviewed.  Constitutional:      Appearance: Normal appearance. He is obese.  HENT:     Right Ear: External ear normal.     Left Ear: External ear normal.      Mouth/Throat:     Mouth: Mucous membranes are moist.  Neck:     Musculoskeletal: Normal range of motion and neck supple.  Cardiovascular:     Pulses: Normal pulses.  Pulmonary:     Effort: Pulmonary effort is normal.  Musculoskeletal: Normal range of motion.        General: Signs of injury present.  Skin:    General: Skin is warm.     Findings: Lesion present.  Neurological:     Mental Status: He is alert.        UC Treatments / Results  Labs (all labs ordered are listed, but only abnormal results are displayed) Labs Reviewed - No data to display  EKG None  Radiology No results found.  Procedures Procedures (including critical care time)  Medications Ordered in UC Medications  Tdap (BOOSTRIX) injection 0.5 mL (has no administration in time range)    Initial Impression / Assessment and Plan / UC Course  I have reviewed the triage vital signs and the nursing notes.  Pertinent labs & imaging results that were available during my care of the patient were reviewed by me and considered in my medical decision making (see chart for details).    Final Clinical Impressions(s) / UC Diagnoses   Final diagnoses:  Cellulitis of left foot     Discharge Instructions     I need to have you come back for a recheck in three days to make sure the toe is healing properly.    ED Prescriptions    Medication Sig Dispense Auth. Provider   metFORMIN (GLUCOPHAGE-XR) 500 MG 24 hr tablet Take 4 tablets (2,000 mg total) by mouth daily with breakfast. 60 tablet Elvina Sidle, MD   doxycycline (VIBRA-TABS) 100 MG tablet Take 1 tablet (100 mg total) by mouth 2 (two) times daily. 20 tablet Elvina Sidle, MD     Controlled Substance Prescriptions [place] Controlled Substance Registry consulted? reviewed   Elvina Sidle, MD 09/05/18 2004

## 2018-09-11 ENCOUNTER — Encounter (HOSPITAL_COMMUNITY): Payer: Self-pay | Admitting: Emergency Medicine

## 2018-09-11 ENCOUNTER — Ambulatory Visit (HOSPITAL_COMMUNITY)
Admission: EM | Admit: 2018-09-11 | Discharge: 2018-09-11 | Disposition: A | Discharge status: Home / Self Care | Payer: Medicare HMO | Attending: Family Medicine | Admitting: Family Medicine

## 2018-09-11 ENCOUNTER — Other Ambulatory Visit: Payer: Self-pay

## 2018-09-11 DIAGNOSIS — Z5189 Encounter for other specified aftercare: Secondary | ICD-10-CM

## 2018-09-11 NOTE — ED Triage Notes (Signed)
Pt here for wound recheck to left foot

## 2018-09-11 NOTE — ED Provider Notes (Signed)
[facility]    CSN: [id number] Arrival date & time: 09/11/18  1241     History   Chief Complaint Chief Complaint  Patient presents with  . Wound Check    HPI Allen Alexander is a 73 y.o. male.   73 yo diabetic man returns for follow up of laceration to little left toe one week after dropping a knife on it.  He was treated with doxycycline.  The wound is now dry and patient has no swelling or pain.  He is taking meds as directed.     Past Medical History:  Diagnosis Date  . Diabetes mellitus without complication [facility])     Patient Active Problem List   Diagnosis Date Noted  . HLD (hyperlipidemia) 09/05/2018  . Diabetic neuropathy (HCC) 09/05/2018  . 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 Coronary artery disease involving native coronary artery 01/13/2017  . Essential hypertension 01/13/2017  . OSA (obstructive sleep apnea) 01/13/2017  . Type 2 diabetes mellitus (HCC) 01/13/2017    Past Surgical History:  Procedure Laterality Date  . CARDIAC SURGERY         Home Medications    Prior to Admission medications   Medication Sig Start Date End Date Taking? Authorizing Provider  aspirin 81 MG chewable tablet Chew 162 mg by mouth daily.    [provider]  atorvastatin (LIPITOR) 40 MG tablet Take 40 mg by mouth daily.    [provider]  Cholecalciferol (VITAMIN D) 2000 units CAPS Take 2,000 Units by mouth every morning.    [provider]  diphenhydrAMINE (BENADRYL) 25 MG tablet Take 25 mg by mouth every 6 (six) hours as needed.    [provider]  doxycycline (VIBRA-TABS) 100 MG tablet Take 1 tablet (100 mg total) by mouth 2 (two) times daily. 09/05/18   Elvina Sidle, MD  insulin aspart (NOVOLOG) 100 UNIT/ML injection Inject 2-10 Units into the skin 3 (three) times daily before meals. Sliding scale. 151-200=2 units. 201-250=4 units.  251-300= 6 units.  351-400=10 units. Greater than 400; call MD    [provider]   insulin glargine (LANTUS) 100 UNIT/ML injection Inject 25 Units into the skin at bedtime.     [provider]  Melatonin 10 MG TABS Take 1 tablet by mouth at bedtime as needed.    [provider]  metFORMIN (GLUCOPHAGE-XR) 500 MG 24 hr tablet Take 4 tablets (2,000 mg total) by mouth daily with breakfast. 09/05/18   Elvina Sidle, MD  metoprolol tartrate (LOPRESSOR) 25 MG tablet Take 12.5 mg by mouth 2 (two) times daily.     [provider]  Multiple Vitamins-Minerals (MULTIVITAMIN WITH MINERALS) tablet Take 1 tablet by mouth daily.    [provider]  niacin 100 MG tablet Take 100 mg by mouth at bedtime.    [provider]    Family History History reviewed. No pertinent family history.  Social History Social History   Tobacco Use  . Smoking status: Never Smoker  . Smokeless tobacco: Never Used  Substance Use Topics  . Alcohol use: Not Currently  . Drug use: Never     Allergies   Hydrochlorothiazide; Lisinopril; and Niacin   Review of Systems Review of Systems  Skin: Positive for wound.  All other systems reviewed and are negative.    Physical Exam Triage Vital Signs ED Triage Vitals [09/11/18 1320]  Enc Vitals Group     BP 131/75     Pulse Rate 74  Resp 18     Temp 97.8 F (36.6 C)     Temp Source Oral     SpO2 96 %     Weight      Height      Head Circumference      Peak Flow      Pain Score 0     Pain Loc      Pain Edu?      Excl. in GC?    No data found.  Updated Vital Signs BP 131/75 (BP Location: Left Arm)   Pulse 74   Temp 97.8 F (36.6 C) (Oral)   Resp 18   SpO2 96%   Physical Exam Vitals signs and nursing note reviewed.  Constitutional:      Appearance: Normal appearance.  Eyes:     Conjunctiva/sclera: Conjunctivae normal.     Pupils: Pupils are equal, round, and reactive to light.  Neck:     Musculoskeletal: Normal range of motion and neck supple.  Skin:    General: Skin is warm and  dry.  Neurological:     Mental Status: He is alert.     Gait: Gait normal.  Psychiatric:        Mood and Affect: Mood normal.        UC Treatments / Results  Labs (all labs ordered are listed, but only abnormal results are displayed) Labs Reviewed - No data to display  EKG None  Radiology No results found.  Procedures Procedures (including critical care time)  Medications Ordered in UC Medications - No data to display  Initial Impression / Assessment and Plan / UC Course  I have reviewed the triage vital signs and the nursing notes.  Pertinent labs & imaging results that were available during my care of the patient were reviewed by me and considered in my medical decision making (see chart for details).    Final Clinical Impressions(s) / UC Diagnoses   Final diagnoses:  Visit for wound check   Discharge Instructions   None    ED Prescriptions    None     Controlled Substance Prescriptions [place] Controlled Substance Registry consulted? Not Applicable   Elvina SidleLauenstein, Tabitha Riggins, MD 09/11/18 [phone]

## 2018-09-11 NOTE — Discharge Instructions (Signed)
Finish antibiotics

## 2018-10-14 ENCOUNTER — Emergency Department (HOSPITAL_COMMUNITY)
Admission: EM | Admit: 2018-10-14 | Discharge: 2018-10-14 | Disposition: A | Discharge status: Home / Self Care | Payer: No Typology Code available for payment source | Attending: Emergency Medicine | Admitting: Emergency Medicine

## 2018-10-14 ENCOUNTER — Other Ambulatory Visit: Payer: Self-pay

## 2018-10-14 ENCOUNTER — Encounter (HOSPITAL_COMMUNITY): Payer: Self-pay

## 2018-10-14 DIAGNOSIS — Z794 Long term (current) use of insulin: Secondary | ICD-10-CM | POA: Diagnosis not present

## 2018-10-14 DIAGNOSIS — Z7982 Long term (current) use of aspirin: Secondary | ICD-10-CM | POA: Diagnosis not present

## 2018-10-14 DIAGNOSIS — R110 Nausea: Secondary | ICD-10-CM | POA: Diagnosis not present

## 2018-10-14 DIAGNOSIS — I10 Essential (primary) hypertension: Secondary | ICD-10-CM | POA: Diagnosis not present

## 2018-10-14 DIAGNOSIS — Z79899 Other long term (current) drug therapy: Secondary | ICD-10-CM | POA: Diagnosis not present

## 2018-10-14 DIAGNOSIS — E1140 Type 2 diabetes mellitus with diabetic neuropathy, unspecified: Secondary | ICD-10-CM | POA: Diagnosis not present

## 2018-10-14 DIAGNOSIS — I2510 Atherosclerotic heart disease of native coronary artery without angina pectoris: Secondary | ICD-10-CM | POA: Diagnosis not present

## 2018-10-14 DIAGNOSIS — R42 Dizziness and giddiness: Secondary | ICD-10-CM | POA: Diagnosis not present

## 2018-10-14 DIAGNOSIS — Z951 Presence of aortocoronary bypass graft: Secondary | ICD-10-CM | POA: Diagnosis not present

## 2018-10-14 LAB — CBC
HCT: 43.1 % (ref 39.0–52.0)
Hemoglobin: 14.6 g/dL (ref 13.0–17.0)
MCH: 30.3 pg (ref 26.0–34.0)
MCHC: 33.9 g/dL (ref 30.0–36.0)
MCV: 89.4 fL (ref 80.0–100.0)
Platelets: 145 10*3/uL — ABNORMAL LOW (ref 150–400)
RBC: 4.82 MIL/uL (ref 4.22–5.81)
RDW: 13.2 % (ref 11.5–15.5)
WBC: 5.8 10*3/uL (ref 4.0–10.5)
nRBC: 0 % (ref 0.0–0.2)

## 2018-10-14 LAB — CBG MONITORING, ED: Glucose-Capillary: 153 mg/dL — ABNORMAL HIGH (ref 70–99)

## 2018-10-14 LAB — BASIC METABOLIC PANEL
Anion gap: 9 (ref 5–15)
BUN: 13 mg/dL (ref 8–23)
CO2: 24 mmol/L (ref 22–32)
Calcium: 8.9 mg/dL (ref 8.9–10.3)
Chloride: 106 mmol/L (ref 98–111)
Creatinine, Ser: 0.79 mg/dL (ref 0.61–1.24)
GFR calc Af Amer: >60 mL/min (ref >60)
GFR calc non Af Amer: >60 mL/min (ref >60)
Glucose, Bld: 163 mg/dL — ABNORMAL HIGH (ref 70–99)
Potassium: 3.9 mmol/L (ref 3.5–5.1)
Sodium: 139 mmol/L (ref 135–145)

## 2018-10-14 MED ORDER — MECLIZINE HCL 25 MG PO TABS: 25.0000 mg | ORAL_TABLET | Freq: Three times a day (TID) | ORAL | 0 refills | Status: AC | PRN

## 2018-10-14 MED ORDER — MECLIZINE HCL 25 MG PO TABS
25.0000 mg | ORAL_TABLET | Freq: Once | ORAL | Status: AC
  Administered 2018-10-14: 25 mg via ORAL
  Filled 2018-10-14: qty 1

## 2018-10-14 MED ORDER — SODIUM CHLORIDE 0.9% FLUSH: 3.0000 mL | Freq: Once | INTRAVENOUS | Status: DC

## 2018-10-14 NOTE — ED Provider Notes (Signed)
[facility]-EMERGENCY DEPT Provider Note   CSN: [id number] Arrival date & time: 10/14/18  1553    History   Chief Complaint Chief Complaint  Patient presents with  . Dizziness    HPI Allen Alexander is a 73 y.o. male.     HPI Patient with history of urticaria for which he takes Antivert.  Stopped taking it 4 days ago.  Developed spinning sensation yesterday evening which resolved.  Had another episode this morning while driving.  Associated with mild nausea.  Symptoms have since improved.  Symptoms are worsened with movement of his head.  Denies ringing in his ears or hearing changes.  No chest pain or shortness of breath.  No focal weakness or numbness.  No abdominal pain or chest tightness. Past Medical History:  Diagnosis Date  . Diabetes mellitus without complication [facility](HCC)     Patient Active Problem List   Diagnosis Date Noted  . HLD (hyperlipidemia) 09/05/2018  . Diabetic neuropathy (HCC) 09/05/2018  . 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 Coronary artery disease involving native coronary artery 01/13/2017  . Essential hypertension 01/13/2017  . OSA (obstructive sleep apnea) 01/13/2017  . Type 2 diabetes mellitus (HCC) 01/13/2017    Past Surgical History:  Procedure Laterality Date  . CARDIAC SURGERY          Home Medications    Prior to Admission medications   Medication Sig Start Date End Date Taking? Authorizing Provider  aspirin 81 MG chewable tablet Chew 81 mg by mouth daily.    Yes [provider]  atorvastatin (LIPITOR) 40 MG tablet Take 40 mg by mouth daily.   Yes [provider]  Cholecalciferol (VITAMIN D) 2000 units CAPS Take 2,000 Units by mouth every morning.   Yes [provider]  diphenhydrAMINE (BENADRYL) 25 MG tablet Take 25 mg by mouth every 6 (six) hours as needed for itching or allergies.    Yes [provider]  insulin aspart (NOVOLOG) 100 UNIT/ML injection Inject 2-10 Units into the skin 3  (three) times daily before meals. Sliding scale. 151-200=2 units. 201-250=4 units.  251-300= 6 units.  351-400=10 units. Greater than 400; call MD   Yes [provider]  insulin glargine (LANTUS) 100 UNIT/ML injection Inject 20 Units into the skin at bedtime.    Yes [provider]  meloxicam (MOBIC) 15 MG tablet Take 15 mg by mouth daily as needed for muscle pain. 10/03/18  Yes [provider]  metFORMIN (GLUCOPHAGE-XR) 500 MG 24 hr tablet Take 4 tablets (2,000 mg total) by mouth daily with breakfast. 09/05/18  Yes Elvina SidleLauenstein, Kurt, MD  methocarbamol (ROBAXIN) 500 MG tablet Take 1,000 mg by mouth 4 (four) times daily as needed for muscle pain. 10/03/18  Yes [provider]  metoprolol tartrate (LOPRESSOR) 25 MG tablet Take 12.5 mg by mouth 2 (two) times daily.    Yes [provider]  Multiple Vitamins-Minerals (MULTIVITAMIN WITH MINERALS) tablet Take 1 tablet by mouth daily.   Yes [provider]  niacin 100 MG tablet Take 100 mg by mouth at bedtime.   Yes [provider]  doxycycline (VIBRA-TABS) 100 MG tablet Take 1 tablet (100 mg total) by mouth 2 (two) times daily. Patient not taking: Reported on 10/14/2018 09/05/18   Elvina SidleLauenstein, Kurt, MD  meclizine (ANTIVERT) 25 MG tablet Take 1 tablet (25 mg total) by mouth 3 (three) times daily as needed for dizziness. 10/14/18   Loren RacerYelverton, Stuti Sandin, MD  Melatonin 10 MG TABS Take 1  tablet by mouth at bedtime as needed (sleep).     [provider]    Family History History reviewed. No pertinent family history.  Social History Social History   Tobacco Use  . Smoking status: Never Smoker  . Smokeless tobacco: Never Used  Substance Use Topics  . Alcohol use: Not Currently  . Drug use: Never     Allergies   Hydrochlorothiazide, Lisinopril, and Niaspan [niacin er]   Review of Systems Review of Systems  Constitutional: Negative for chills and fever.  HENT: Negative for trouble  swallowing.   Eyes: Negative for visual disturbance.  Respiratory: Negative for cough and shortness of breath.   Cardiovascular: Negative for chest pain.  Gastrointestinal: Positive for nausea. Negative for abdominal pain, diarrhea and vomiting.  Musculoskeletal: Negative for back pain, myalgias and neck pain.  Skin: Negative for rash and wound.  Neurological: Positive for dizziness. Negative for syncope, weakness, numbness and headaches.  All other systems reviewed and are negative.    Physical Exam Updated Vital Signs BP 133/72   Pulse 63   Temp 98.2 F (36.8 C) (Oral)   Resp 16   SpO2 95%   Physical Exam Vitals signs and nursing note reviewed.  Constitutional:      Appearance: Normal appearance. He is well-developed.  HENT:     Head: Normocephalic and atraumatic.     Nose: Nose normal.     Mouth/Throat:     Mouth: Mucous membranes are moist.  Eyes:     Pupils: Pupils are equal, round, and reactive to light.     Comments: Fatigable horizontal nystagmus worse with looking to the left.  Neck:     Musculoskeletal: Normal range of motion and neck supple.  Cardiovascular:     Rate and Rhythm: Normal rate and regular rhythm.     Heart sounds: No murmur. No friction rub. No gallop.   Pulmonary:     Effort: Pulmonary effort is normal.     Breath sounds: Normal breath sounds.  Abdominal:     General: Bowel sounds are normal.     Palpations: Abdomen is soft.     Tenderness: There is no abdominal tenderness. There is no guarding or rebound.  Musculoskeletal: Normal range of motion.        General: No swelling, tenderness or deformity.     Right lower leg: No edema.  Skin:    General: Skin is warm and dry.     Findings: No erythema or rash.  Neurological:     General: No focal deficit present.     Mental Status: He is alert and oriented to person, place, and time.     Comments: Patient is alert and oriented x3 with clear, goal oriented speech. Patient has 5/5 motor in all  extremities. Sensation is intact to light touch. Bilateral finger-to-nose is normal with no signs of dysmetria.   Psychiatric:        Behavior: Behavior normal.      ED Treatments / Results  Labs (all labs ordered are listed, but only abnormal results are displayed) Labs Reviewed  BASIC METABOLIC PANEL - Abnormal; Notable for the following components:      Result Value   Glucose, Bld 163 (*)    All other components within normal limits  CBC - Abnormal; Notable for the following components:   Platelets 145 (*)    All other components within normal limits  CBG MONITORING, ED - Abnormal; Notable for the following components:   Glucose-Capillary  153 (*)    All other components within normal limits    EKG EKG Interpretation  Date/Time:  Saturday October 14 2018 16:05:35 EDT Ventricular Rate:  68 PR Interval:    QRS Duration: 152 QT Interval:  444 QTC Calculation: 473 R Axis:   -34 Text Interpretation:  Sinus rhythm Right bundle branch block Inferior infarct, old Baseline wander in lead(s) II III aVF Confirmed by Loren RacerYelverton, Davonne Baby ([id number]) on 10/14/2018 4:26:32 PM   Radiology No results found.  Procedures Procedures (including critical care time)  Medications Ordered in ED Medications  sodium chloride flush (NS) 0.9 % injection 3 mL (0 mLs Intravenous Hold 10/14/18 1616)  meclizine (ANTIVERT) tablet 25 mg (25 mg Oral Given 10/14/18 1723)     Initial Impression / Assessment and Plan / ED Course  I have reviewed the triage vital signs and the nursing notes.  Pertinent labs & imaging results that were available during my care of the patient were reviewed by me and considered in my medical decision making (see chart for details).       Patient states he is feeling much better after meclizine.  Ambulating without need for assistance.  Vital signs remained stable in the emergency department.  Normal neurologic exam.  Patient symptoms likely due to his recurrent vertigo.  Do not  believe that emergent imaging is necessary at this point.  Patient will follow-up with his primary physician and return precautions given.   Final Clinical Impressions(s) / ED Diagnoses   Final diagnoses:  Vertigo    ED Discharge Orders         Ordered    meclizine (ANTIVERT) 25 MG tablet  3 times daily PRN     10/14/18 1839           Loren RacerYelverton, Valli Randol, MD 10/14/18 1839

## 2018-10-14 NOTE — ED Notes (Signed)
Patient ambulated around room says "dizziness is much improved, nothing like it was when I first got here"

## 2018-10-14 NOTE — ED Triage Notes (Addendum)
Patient dropped off by son.  Patient goes by Allen Alexander not CHUCK  C/o dizziness that started last evening. Then this morning he got up to drive to grocery store and felt dizzy again while driving and turned around and went home. Denies falling or syncopal episode.  Patient states he has had vertigo in the past and used Antivert for it with relief.   C/O Nausea  Denies abdominal pain   Patient states 2 weeks ago he started taking Muscle relaxer's that he thinks could have contributed to his dizziness. Patient states he took medication last night.   A/ox4 Ambulatory in triage.     Hx. Bypass surgery, DM 2  Patient states he had a routine physical and blood draw 2 weeks ago. Patient states A1c was 7.4 and was negative for Covid.   EKG has been done in triage.

## 2018-10-23 ENCOUNTER — Encounter (HOSPITAL_COMMUNITY): Payer: Self-pay | Admitting: Emergency Medicine

## 2018-10-23 ENCOUNTER — Emergency Department (HOSPITAL_COMMUNITY)
Admission: EM | Admit: 2018-10-23 | Discharge: 2018-10-23 | Disposition: A | Discharge status: Home / Self Care | Payer: No Typology Code available for payment source | Attending: Emergency Medicine | Admitting: Emergency Medicine

## 2018-10-23 ENCOUNTER — Other Ambulatory Visit: Payer: Self-pay

## 2018-10-23 ENCOUNTER — Emergency Department (HOSPITAL_COMMUNITY): Payer: No Typology Code available for payment source

## 2018-10-23 DIAGNOSIS — Z794 Long term (current) use of insulin: Secondary | ICD-10-CM | POA: Diagnosis not present

## 2018-10-23 DIAGNOSIS — E1140 Type 2 diabetes mellitus with diabetic neuropathy, unspecified: Secondary | ICD-10-CM | POA: Insufficient documentation

## 2018-10-23 DIAGNOSIS — R05 Cough: Secondary | ICD-10-CM | POA: Diagnosis not present

## 2018-10-23 DIAGNOSIS — J45909 Unspecified asthma, uncomplicated: Secondary | ICD-10-CM | POA: Insufficient documentation

## 2018-10-23 DIAGNOSIS — Z951 Presence of aortocoronary bypass graft: Secondary | ICD-10-CM | POA: Diagnosis not present

## 2018-10-23 DIAGNOSIS — I10 Essential (primary) hypertension: Secondary | ICD-10-CM | POA: Diagnosis not present

## 2018-10-23 DIAGNOSIS — Z7982 Long term (current) use of aspirin: Secondary | ICD-10-CM | POA: Diagnosis not present

## 2018-10-23 DIAGNOSIS — R0989 Other specified symptoms and signs involving the circulatory and respiratory systems: Secondary | ICD-10-CM | POA: Diagnosis not present

## 2018-10-23 DIAGNOSIS — I2510 Atherosclerotic heart disease of native coronary artery without angina pectoris: Secondary | ICD-10-CM | POA: Insufficient documentation

## 2018-10-23 DIAGNOSIS — Z79899 Other long term (current) drug therapy: Secondary | ICD-10-CM | POA: Insufficient documentation

## 2018-10-23 DIAGNOSIS — R059 Cough, unspecified: Secondary | ICD-10-CM

## 2018-10-23 DIAGNOSIS — J029 Acute pharyngitis, unspecified: Secondary | ICD-10-CM | POA: Diagnosis present

## 2018-10-23 NOTE — ED Triage Notes (Signed)
Pt c/o raspy throat for couple days then today became sore. Adds has cough with white phlegm.

## 2018-10-23 NOTE — ED Provider Notes (Signed)
[facility]-EMERGENCY DEPT Provider Note   CSN: [id number] Arrival date & time: 10/23/18  [id number]    History   Chief Complaint Chief Complaint  Patient presents with  . Sore Throat  . Cough    HPI Allen Alexander is a 73 y.o. male.     HPI  Patient presents for evaluation of congestion in his throat causing "raspy voice."  Allen Alexander complains of cough with white sputum production, without bleeding.  Allen Alexander denies fever, chills, nausea, vomiting, focal weakness or paresthesia.  No known sick exposures.  Allen Alexander has been trying to avoid COVID-19.  There are no other known modifying factors.   Past Medical History:  Diagnosis Date  . Diabetes mellitus without complication [facility](HCC)     Patient Active Problem List   Diagnosis Date Noted  . HLD (hyperlipidemia) 09/05/2018  . Diabetic neuropathy (HCC) 09/05/2018  . 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 Coronary artery disease involving native coronary artery 01/13/2017  . Essential hypertension 01/13/2017  . OSA (obstructive sleep apnea) 01/13/2017  . Type 2 diabetes mellitus (HCC) 01/13/2017    Past Surgical History:  Procedure Laterality Date  . CARDIAC SURGERY          Home Medications    Prior to Admission medications   Medication Sig Start Date End Date Taking? Authorizing Provider  aspirin 81 MG chewable tablet Chew 81 mg by mouth daily.     [provider]  atorvastatin (LIPITOR) 40 MG tablet Take 40 mg by mouth daily.    [provider]  Cholecalciferol (VITAMIN D) 2000 units CAPS Take 2,000 Units by mouth every morning.    [provider]  diphenhydrAMINE (BENADRYL) 25 MG tablet Take 25 mg by mouth every 6 (six) hours as needed for itching or allergies.     [provider]  doxycycline (VIBRA-TABS) 100 MG tablet Take 1 tablet (100 mg total) by mouth 2 (two) times daily. Patient not taking: Reported on 10/14/2018 09/05/18   Elvina SidleLauenstein, Kurt, MD  insulin aspart (NOVOLOG) 100 UNIT/ML  injection Inject 2-10 Units into the skin 3 (three) times daily before meals. Sliding scale. 151-200=2 units. 201-250=4 units.  251-300= 6 units.  351-400=10 units. Greater than 400; call MD    [provider]  insulin glargine (LANTUS) 100 UNIT/ML injection Inject 20 Units into the skin at bedtime.     [provider]  meclizine (ANTIVERT) 25 MG tablet Take 1 tablet (25 mg total) by mouth 3 (three) times daily as needed for dizziness. 10/14/18   Loren RacerYelverton, David, MD  Melatonin 10 MG TABS Take 1 tablet by mouth at bedtime as needed (sleep).     [provider]  meloxicam (MOBIC) 15 MG tablet Take 15 mg by mouth daily as needed for muscle pain. 10/03/18   [provider]  metFORMIN (GLUCOPHAGE-XR) 500 MG 24 hr tablet Take 4 tablets (2,000 mg total) by mouth daily with breakfast. 09/05/18   Elvina SidleLauenstein, Kurt, MD  methocarbamol (ROBAXIN) 500 MG tablet Take 1,000 mg by mouth 4 (four) times daily as needed for muscle pain. 10/03/18   [provider]  metoprolol tartrate (LOPRESSOR) 25 MG tablet Take 12.5 mg by mouth 2 (two) times daily.     [provider]  Multiple Vitamins-Minerals (MULTIVITAMIN WITH MINERALS) tablet Take 1 tablet by mouth daily.    [provider]  niacin 100 MG tablet Take 100 mg by mouth at bedtime.    [provider]    Family History  No family history on file.  Social History Social History   Tobacco Use  . Smoking status: Never Smoker  . Smokeless tobacco: Never Used  Substance Use Topics  . Alcohol use: Not Currently  . Drug use: Never     Allergies   Hydrochlorothiazide, Lisinopril, and Niaspan [niacin er]   Review of Systems Review of Systems  All other systems reviewed and are negative.    Physical Exam Updated Vital Signs BP (!) 116/57 (BP Location: Right Arm)   Pulse 60   Temp 97.6 F (36.4 C) (Oral)   Resp 18   SpO2 96%   Physical Exam Vitals signs and nursing note reviewed.   Constitutional:      General: Allen Alexander is not in acute distress.    Appearance: Allen Alexander is well-developed. Allen Alexander is not ill-appearing, toxic-appearing or diaphoretic.  HENT:     Head: Normocephalic and atraumatic.     Right Ear: External ear normal.     Left Ear: External ear normal.     Nose: No congestion or rhinorrhea.     Mouth/Throat:     Pharynx: No oropharyngeal exudate or posterior oropharyngeal erythema.  Eyes:     Conjunctiva/sclera: Conjunctivae normal.     Pupils: Pupils are equal, round, and reactive to light.  Neck:     Musculoskeletal: Normal range of motion and neck supple.     Trachea: Phonation normal.  Cardiovascular:     Rate and Rhythm: Normal rate and regular rhythm.     Heart sounds: Normal heart sounds.  Pulmonary:     Effort: Pulmonary effort is normal. No respiratory distress.     Breath sounds: Normal breath sounds. No stridor. No wheezing or rhonchi.     Comments: Occasional congested cough during examination. Abdominal:     Palpations: Abdomen is soft.     Tenderness: There is no abdominal tenderness.  Musculoskeletal: Normal range of motion.  Skin:    General: Skin is warm and dry.  Neurological:     Mental Status: Allen Alexander is alert and oriented to person, place, and time.     Cranial Nerves: No cranial nerve deficit.     Sensory: No sensory deficit.     Motor: No abnormal muscle tone.     Coordination: Coordination normal.  Psychiatric:        Mood and Affect: Mood normal.        Behavior: Behavior normal.        Thought Content: Thought content normal.        Judgment: Judgment normal.      ED Treatments / Results  Labs (all labs ordered are listed, but only abnormal results are displayed) Labs Reviewed - No data to display  EKG None  Radiology Dg Chest 2 View  Result Date: 10/23/2018 CLINICAL DATA:  Persistent cough 2-3 days. Hx of bypass in 2018, diabetic, currently wearing a portable heart monitor due to weak heart rate EXAM: CHEST - 2 VIEW  COMPARISON:  01/21/2017 FINDINGS: Stable changes from previous cardiac surgery. Cardiac silhouette is normal in size. No mediastinal or hilar masses. No evidence of adenopathy. Right anterior chest wall pacemaker is stable. Lungs are clear.  No pleural effusion or pneumothorax. Skeletal structures are intact. IMPRESSION: No acute cardiopulmonary disease. Electronically Signed   By: Amie Portlandavid  Ormond M.D.   On: 10/23/2018 12:28    Procedures Procedures (including critical care time)  Medications Ordered in ED Medications - No data to display   Initial Impression / Assessment and  Plan / ED Course  I have reviewed the triage vital signs and the nursing notes.  Pertinent labs & imaging results that were available during my care of the patient were reviewed by me and considered in my medical decision making (see chart for details).  Clinical Course as of Oct 23 1250  Mon Oct 23, 2018  1245 No infiltrate or CHF, images reviewed by me  DG Chest 2 View [EW]    Clinical Course User Index [EW] Daleen Bo, MD        Patient Vitals for the past 24 hrs:  BP Temp Temp src Pulse Resp SpO2  10/23/18 1233 (!) 116/57 97.6 F (36.4 C) Oral 60 18 96 %  10/23/18 0943 (!) 149/66 98.3 F (36.8 C) Oral 69 17 98 %    12:45 PM Reevaluation with update and discussion. After initial assessment and treatment, an updated evaluation reveals no further complaints, findings discussed with the patient and all questions were answered. Daleen Bo   Medical Decision Making: Nonspecific cough, differential diagnosis includes nonspecific viral process, etiologies seen bronchitis.  Allen Alexander is an ex smoker.  Doubt Covid-19 infection.  Doubt serious bacterial infection.  Doubt metabolic instability.  Allen Alexander was evaluated in Emergency Department on 10/23/2018 for the symptoms described in the history of present illness. Allen Alexander was evaluated in the context of the global COVID-19 pandemic, which necessitated  consideration that the patient might be at risk for infection with the SARS-CoV-2 virus that causes COVID-19. Institutional protocols and algorithms that pertain to the evaluation of patients at risk for COVID-19 are in a state of rapid change based on information released by regulatory bodies including the CDC and federal and state organizations. These policies and algorithms were followed during the patient's care in the ED.   CRITICAL CARE- No Performed by: Daleen Bo   Nursing Notes Reviewed/ Care Coordinated Applicable Imaging Reviewed Interpretation of Laboratory Data incorporated into ED treatment  The patient appears reasonably screened and/or stabilized for discharge and I doubt any other medical condition or other [facility] requiring further screening, evaluation, or treatment in the ED at this time prior to discharge.  Plan: Home Medications-recommend to use Robitussin-DM for cough and congestion, continue routine medications; Home Treatments-rest, fluids; return here if the recommended treatment, does not improve the symptoms; Recommended follow up-PCP, PRN     Final Clinical Impressions(s) / ED Diagnoses   Final diagnoses:  Cough  Chest congestion    ED Discharge Orders    None       Daleen Bo, MD 10/23/18 1252

## 2018-10-23 NOTE — Discharge Instructions (Addendum)
Chest x-ray does not show any pneumonia or other problems.  You likely have bronchitis of a nonspecific nature.  To treat this we recommend using Robitussin-DM, and making sure you stay well-hydrated by drinking plenty of water.  For pain or fever use Tylenol.  See your doctor if not improving in 2 to 4 days.

## 2019-01-19 IMAGING — DX DG CHEST 1V PORT
2 series · 2 of 2 positions shown · non-contrast
Comparison: None.

CLINICAL DATA: Acute onset of shortness of breath. Initial
encounter.

EXAM:
PORTABLE CHEST 1 VIEW

[chest ap (1 of 2)]
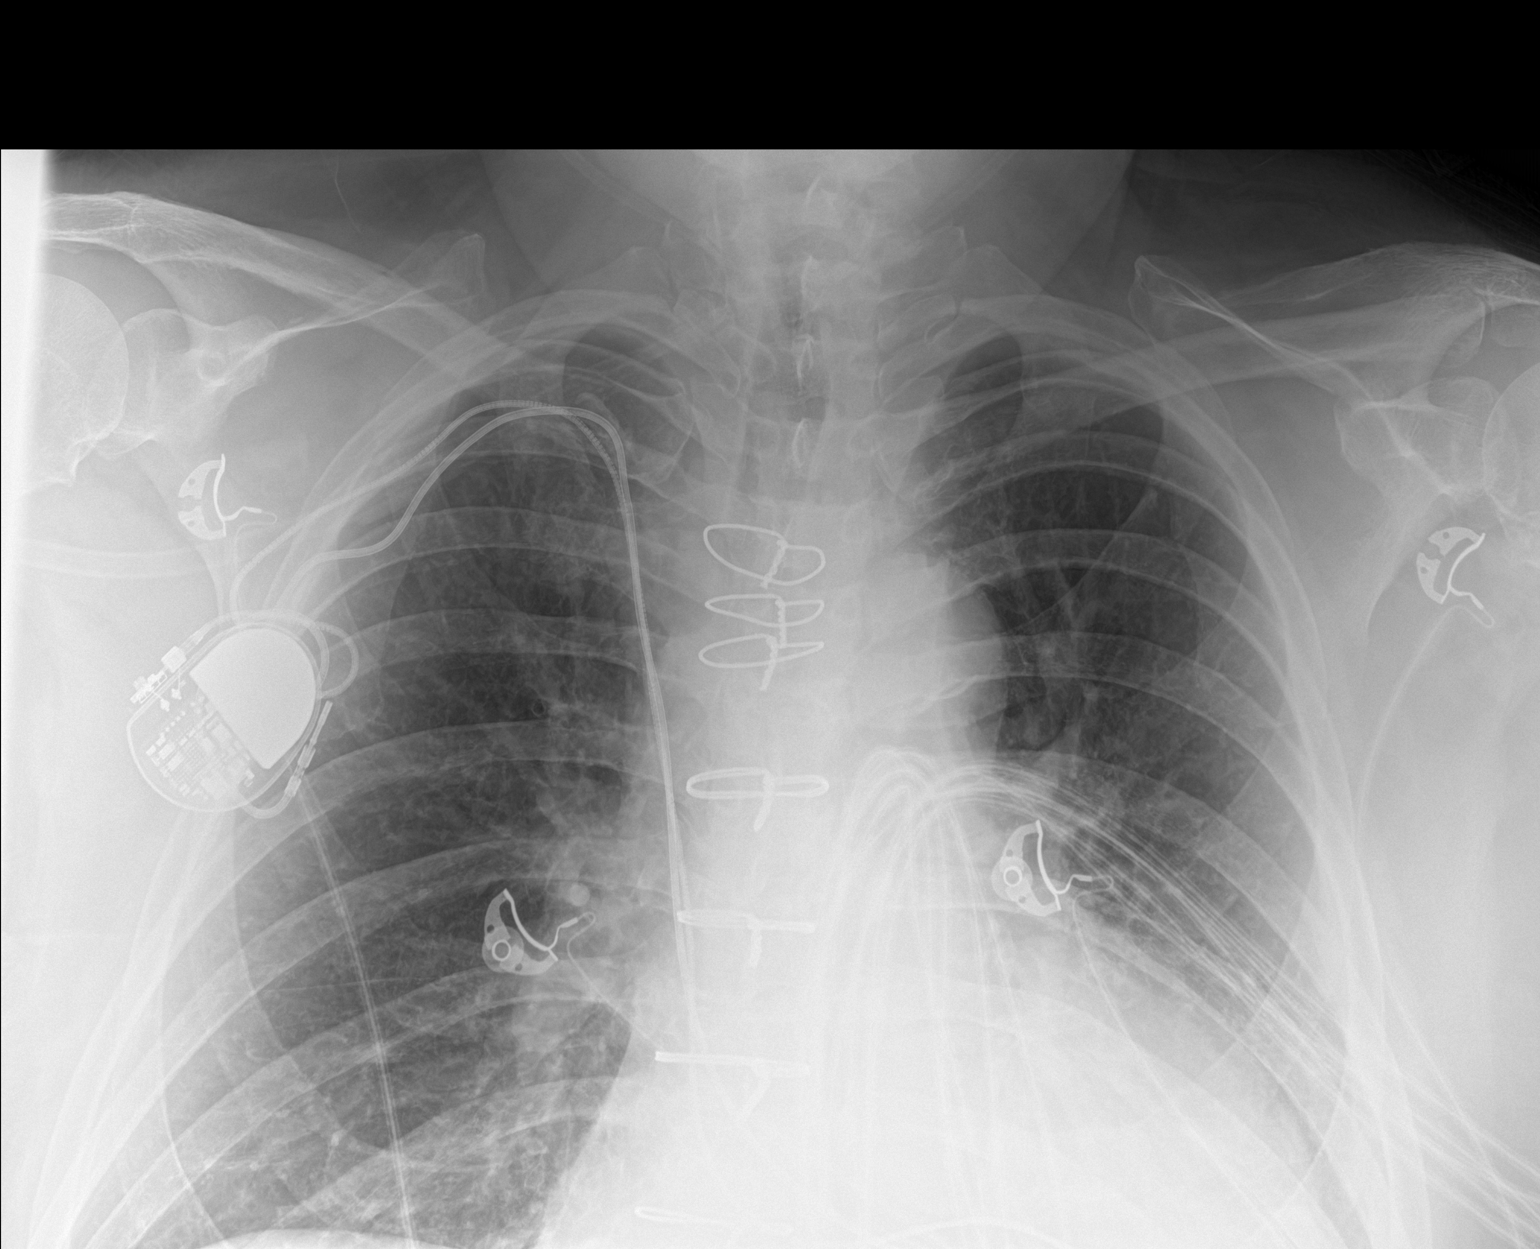

[chest ap (2 of 2)]
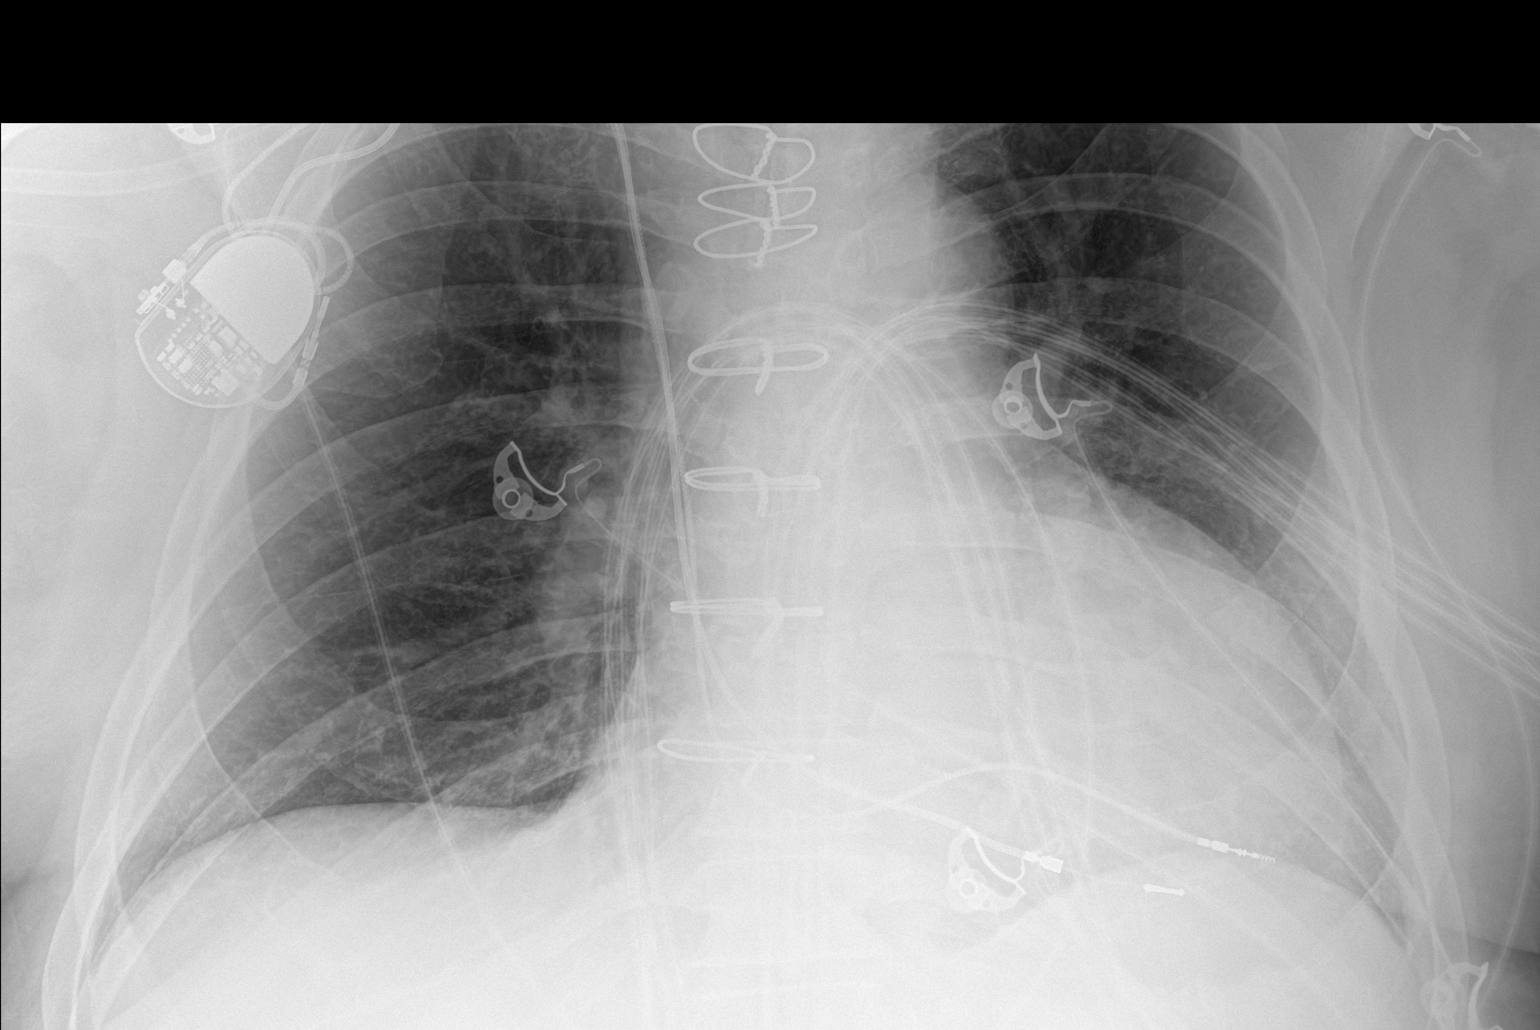

[2 of 2 positions shown; findings below may reference images not displayed]

FINDINGS: The lungs are well-aerated and clear. There is no evidence of focal
opacification, pleural effusion or pneumothorax.

The cardiomediastinal silhouette is borderline enlarged. The patient
is status post median sternotomy. A pacemaker is noted overlying the
right chest wall, with leads ending overlying the right ventricle.
No acute osseous abnormalities are seen.
IMPRESSION: Borderline cardiomegaly.  Lungs remain grossly clear.

## 2019-07-30 ENCOUNTER — Encounter (HOSPITAL_COMMUNITY): Payer: Self-pay

## 2019-07-30 ENCOUNTER — Ambulatory Visit (HOSPITAL_COMMUNITY)
Admission: EM | Admit: 2019-07-30 | Discharge: 2019-07-30 | Disposition: A | Discharge status: Home / Self Care | Payer: Medicare PPO

## 2019-07-30 ENCOUNTER — Other Ambulatory Visit: Payer: Self-pay

## 2019-07-30 DIAGNOSIS — R0982 Postnasal drip: Secondary | ICD-10-CM

## 2019-07-30 DIAGNOSIS — J029 Acute pharyngitis, unspecified: Secondary | ICD-10-CM

## 2019-07-30 DIAGNOSIS — J309 Allergic rhinitis, unspecified: Secondary | ICD-10-CM

## 2019-07-30 MED ORDER — FLUTICASONE PROPIONATE 50 MCG/ACT NA SUSP: 1.0000 | Freq: Every day | NASAL | 2 refills | Status: AC

## 2019-07-30 MED ORDER — CETIRIZINE HCL 10 MG PO TABS: 5.0000 mg | ORAL_TABLET | Freq: Every day | ORAL | 0 refills | Status: AC

## 2019-07-30 MED ORDER — CEPACOL SORE THROAT 10-2.1 MG MT LOZG: 1.0000 | LOZENGE | OROMUCOSAL | 0 refills | Status: AC | PRN

## 2019-07-30 NOTE — Discharge Instructions (Signed)
Use the lozenges and take 2 regular strength Tylenol for your sore throat Take Zyrtec 5 mg daily which is one half of a tablet Use the Flonase daily  If symptoms are not improving over the next 3 to 5 days please return.  If developing worsening cough and shortness of breath or fever please return.

## 2019-07-30 NOTE — ED Provider Notes (Signed)
[place]    CSN: [id number] Arrival date & time: 07/30/19  1823      History   Chief Complaint Chief Complaint  Patient presents with  . Allergies    HPI Allen Alexander is a 74 y.o. male.   Patient reports for evaluation of sore throat as well as nasal congestion and sneezing.  He attributes this to his allergies.  He reports this time each year he gets a lot of allergies that cause nasal drainage and eventually a sore throat.  His primary concern is the sore throat today which has been slowly worsening over the last 5 days.  Reports he is able to eat and drink.  He does report feeling drainage running down the back of his throat.  He has had a slight cough that he believes is due to the drainage in his throat.  Denies shortness of breath or chest pain.  Denies headache, nausea, vomiting, diarrhea.  Patient reports he completed his Covid vaccine series in February.  He reports receiving the [place] vaccine     Past Medical History:  Diagnosis Date  . Diabetes mellitus without complication [facility])     Patient Active Problem List   Diagnosis Date Noted  . HLD (hyperlipidemia) 09/05/2018  . Diabetic neuropathy ([place]) 09/05/2018  . 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 Coronary artery disease involving native coronary artery 01/13/2017  . Essential hypertension 01/13/2017  . OSA (obstructive sleep apnea) 01/13/2017  . Type 2 diabetes mellitus ([place]) 01/13/2017    Past Surgical History:  Procedure Laterality Date  . CARDIAC SURGERY         Home Medications    Prior to Admission medications   Medication Sig Start Date End Date Taking? Authorizing Provider  polyethylene glycol powder (GLYCOLAX/MIRALAX) 17 GM/SCOOP powder Take by mouth. 02/01/17  Yes [provider]  aspirin 81 MG chewable tablet Chew 81 mg by mouth daily.     [provider]  atorvastatin (LIPITOR) 40 MG tablet Take 40 mg by mouth daily.    [provider]    Benzocaine-Menthol (CEPACOL SORE THROAT) 10-2.1 MG LOZG Use as directed 1 lozenge in the mouth or throat every 4 (four) hours as needed. 07/30/19   Horst Ostermiller, Marguerita Beards, PA-C  cetirizine (ZYRTEC ALLERGY) 10 MG tablet Take 0.5 tablets (5 mg total) by mouth daily. 07/30/19 08/29/19  Iasiah Ozment, Marguerita Beards, PA-C  Cholecalciferol (VITAMIN D) 2000 units CAPS Take 2,000 Units by mouth every morning.    [provider]  diphenhydrAMINE (BENADRYL) 25 MG tablet Take 25 mg by mouth every 6 (six) hours as needed for itching or allergies.     [provider]  doxycycline (VIBRA-TABS) 100 MG tablet Take 1 tablet (100 mg total) by mouth 2 (two) times daily. Patient not taking: Reported on 10/14/2018 09/05/18   Robyn Haber, MD  fluticasone [facility]) 50 MCG/ACT nasal spray Place 1 spray into both nostrils daily. 07/30/19   Argyle Gustafson, Marguerita Beards, PA-C  insulin aspart (NOVOLOG) 100 UNIT/ML injection Inject 2-10 Units into the skin 3 (three) times daily before meals. Sliding scale. 151-200=2 units. 201-250=4 units.  251-300= 6 units.  351-400=10 units. Greater than 400; call MD    [provider]  insulin glargine (LANTUS) 100 UNIT/ML injection Inject 20 Units into the skin at bedtime.     [provider]  meclizine (ANTIVERT) 25 MG tablet Take 1 tablet (25 mg total) by mouth 3 (three) times daily as needed for dizziness. 10/14/18  Loren Racer, MD  Melatonin 10 MG TABS Take 1 tablet by mouth at bedtime as needed (sleep).     [provider]  meloxicam (MOBIC) 15 MG tablet Take 15 mg by mouth daily as needed for muscle pain. 10/03/18   [provider]  metFORMIN (GLUCOPHAGE-XR) 500 MG 24 hr tablet Take 4 tablets (2,000 mg total) by mouth daily with breakfast. 09/05/18   Elvina Sidle, MD  methocarbamol (ROBAXIN) 500 MG tablet Take 1,000 mg by mouth 4 (four) times daily as needed for muscle pain. 10/03/18   [provider]  metoprolol tartrate (LOPRESSOR) 25 MG tablet Take 12.5 mg  by mouth 2 (two) times daily.     [provider]  Multiple Vitamins-Minerals (MULTIVITAMIN WITH MINERALS) tablet Take 1 tablet by mouth daily.    [provider]  niacin 100 MG tablet Take 100 mg by mouth at bedtime.    [provider]    Family History Family History  Problem Relation Age of Onset  . Hypertension Mother   . Healthy Father     Social History Social History   Tobacco Use  . Smoking status: Never Smoker  . Smokeless tobacco: Never Used  Substance Use Topics  . Alcohol use: Not Currently  . Drug use: Never     Allergies   Hydrochlorothiazide, Lisinopril, and Niaspan [niacin er]   Review of Systems Review of Systems Per HPI  Physical Exam Triage Vital Signs ED Triage Vitals  Enc Vitals Group     BP 07/30/19 1916 136/76     Pulse Rate 07/30/19 1916 79     Resp 07/30/19 1916 19     Temp 07/30/19 1916 98.1 F (36.7 C)     Temp Source 07/30/19 1916 Oral     SpO2 07/30/19 1916 99 %     Weight 07/30/19 1913 266 lb (120.7 kg)     Height --      Head Circumference --      Peak Flow --      Pain Score 07/30/19 1913 0     Pain Loc --      Pain Edu? --      Excl. in GC? --    No data found.  Updated Vital Signs BP 136/76 (BP Location: Right Arm)   Pulse 79   Temp 98.1 F (36.7 C) (Oral)   Resp 19   Wt 266 lb (120.7 kg)   SpO2 99%   BMI 38.17 kg/m   Visual Acuity Right Eye Distance:   Left Eye Distance:   Bilateral Distance:    Right Eye Near:   Left Eye Near:    Bilateral Near:     Physical Exam Vitals and nursing note reviewed.  Constitutional:      Appearance: He is well-developed.  HENT:     Head: Normocephalic and atraumatic.     Nose:     Comments: Boggy pale turbinates    Mouth/Throat:     Mouth: Mucous membranes are moist.     Comments: Postnasal drip in the posterior oropharynx.  No erythema or exudates Eyes:     Extraocular Movements: Extraocular movements intact.     Conjunctiva/sclera:  Conjunctivae normal.     Pupils: Pupils are equal, round, and reactive to light.  Cardiovascular:     Rate and Rhythm: Normal rate and regular rhythm.     Heart sounds: No murmur.  Pulmonary:     Effort: Pulmonary effort is normal. No respiratory distress.  Breath sounds: Normal breath sounds. No wheezing, rhonchi or rales.  Musculoskeletal:     Cervical back: Neck supple.  Skin:    General: Skin is warm and dry.  Neurological:     Mental Status: He is alert.      UC Treatments / Results  Labs (all labs ordered are listed, but only abnormal results are displayed) Labs Reviewed - No data to display  EKG   Radiology No results found.  Procedures Procedures (including critical care time)  Medications Ordered in UC Medications - No data to display  Initial Impression / Assessment and Plan / UC Course  I have reviewed the triage vital signs and the nursing notes.  Pertinent labs & imaging results that were available during my care of the patient were reviewed by me and considered in my medical decision making (see chart for details).     #Allergic rhinitis with postnasal drip #Pharyngitis Patient is a 74 year old male with pharyngitis secondary to postnasal drip.  He has accompanying allergic rhinitis as well.  Given patient has completed Covid vaccine series lack of fever and significant cough will defer Covid testing.  Does appear this is allergy driven primarily.  Will recommend starting Zyrtec 5 mg and daily Flonase use.  Cepacol lozenges for throat comfort.  Return and follow-up precautions were discussed with patient. Final Clinical Impressions(s) / UC Diagnoses   Final diagnoses:  Allergic rhinitis with postnasal drip  Acute pharyngitis, unspecified etiology     Discharge Instructions     Use the lozenges and take 2 regular strength Tylenol for your sore throat Take Zyrtec 5 mg daily which is one half of a tablet Use the Flonase daily  If symptoms are  not improving over the next 3 to 5 days please return.  If developing worsening cough and shortness of breath or fever please return.      ED Prescriptions    Medication Sig Dispense Auth. Provider   Benzocaine-Menthol (CEPACOL SORE THROAT) 10-2.1 MG LOZG Use as directed 1 lozenge in the mouth or throat every 4 (four) hours as needed. 18 lozenge Demitrius Crass, Veryl Speak, PA-C   cetirizine (ZYRTEC ALLERGY) 10 MG tablet Take 0.5 tablets (5 mg total) by mouth daily. 15 tablet Yancey Pedley, Veryl Speak, PA-C   fluticasone (FLONASE) 50 MCG/ACT nasal spray Place 1 spray into both nostrils daily. 11.1 mL Cace Osorto, Veryl Speak, PA-C     PDMP not reviewed this encounter.   Hermelinda Medicus, PA-C 07/31/19 [phone]

## 2019-07-30 NOTE — ED Triage Notes (Signed)
Pt is here with minor allergies that started 5 days ago, states he moved here from [place] & the pollen is hard on him.

## 2019-08-02 ENCOUNTER — Other Ambulatory Visit: Payer: Self-pay

## 2019-08-02 ENCOUNTER — Encounter (HOSPITAL_COMMUNITY): Payer: Self-pay

## 2019-08-02 ENCOUNTER — Ambulatory Visit (HOSPITAL_COMMUNITY)
Admission: EM | Admit: 2019-08-02 | Discharge: 2019-08-02 | Disposition: A | Discharge status: Home / Self Care | Payer: No Typology Code available for payment source | Attending: Family Medicine | Admitting: Family Medicine

## 2019-08-02 DIAGNOSIS — J040 Acute laryngitis: Secondary | ICD-10-CM | POA: Diagnosis not present

## 2019-08-02 HISTORY — DX: Essential (primary) hypertension: I10

## 2019-08-02 HISTORY — DX: Hyperlipidemia, unspecified: E78.5

## 2019-08-02 MED ORDER — METHYLPREDNISOLONE 4 MG PO TBPK: 4.0000 mg | ORAL_TABLET | Freq: Every day | ORAL | 0 refills | Status: AC

## 2019-08-02 NOTE — ED Triage Notes (Signed)
Pt c/o 7/10 pain in throatx1 wk. Pt c/o occasional dry cough. Pt denies dysphagia. Pt states he has a very hoarse voice when he wakes up in the mornings.

## 2019-08-02 NOTE — Discharge Instructions (Addendum)
I have sent in a 5 day course of steroids for you to take for laryngitis.  You may also take a whole tablet of the zyrtec for allergies.  Follow up  if symptoms are not improving by the end of the weekend.

## 2019-08-03 NOTE — ED Provider Notes (Signed)
[facility]    CSN: [id number] Arrival date & time: 08/02/19  1445      History   Chief Complaint Chief Complaint  Patient presents with  . Sore Throat    HPI Allen Alexander is a 74 y.o. male.   Patient reports that he has been experiencing loss of voice for the last 2 weeks.  He reports that he was seen in our office 3 days ago.  He was given 5 mg Zyrtec to take daily.  He reports that he has not improved.  Denies headache, sore throat, shortness of breath, nausea, vomiting, diarrhea, rash, fever, other symptoms.  Per chart review, patient has medical history significant for coronary artery disease, hypertension, sleep apnea, type 2 diabetes, hyperlipidemia, status post CABG x4.    The history is provided by the patient.  Sore Throat    Past Medical History:  Diagnosis Date  . Diabetes mellitus without complication ([facility])   . Hyperlipidemia   . Hypertension     Patient Active Problem List   Diagnosis Date Noted  . HLD (hyperlipidemia) 09/05/2018  . Diabetic neuropathy ([facility]) 09/05/2018  . 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 Coronary artery disease involving native coronary artery 01/13/2017  . Essential hypertension 01/13/2017  . OSA (obstructive sleep apnea) 01/13/2017  . Type 2 diabetes mellitus ([facility]) 01/13/2017    Past Surgical History:  Procedure Laterality Date  . CARDIAC SURGERY         Home Medications    Prior to Admission medications   Medication Sig Start Date End Date Taking? Authorizing Provider  aspirin 81 MG chewable tablet Chew 81 mg by mouth daily.     [provider]  atorvastatin (LIPITOR) 40 MG tablet Take 40 mg by mouth daily.    [provider]  Benzocaine-Menthol (CEPACOL SORE THROAT) 10-2.1 MG LOZG Use as directed 1 lozenge in the mouth or throat every 4 (four) hours as needed. 07/30/19   Darr, Veryl Speak, PA-C  cetirizine (ZYRTEC ALLERGY) 10 MG tablet Take 0.5 tablets (5 mg total) by mouth daily. 07/30/19 08/29/19   Darr, Veryl Speak, PA-C  Cholecalciferol (VITAMIN D) 2000 units CAPS Take 2,000 Units by mouth every morning.    [provider]  diphenhydrAMINE (BENADRYL) 25 MG tablet Take 25 mg by mouth every 6 (six) hours as needed for itching or allergies.     [provider]  doxycycline (VIBRA-TABS) 100 MG tablet Take 1 tablet (100 mg total) by mouth 2 (two) times daily. Patient not taking: Reported on 10/14/2018 09/05/18   Elvina Sidle, MD  fluticasone [facility]) 50 MCG/ACT nasal spray Place 1 spray into both nostrils daily. 07/30/19   Darr, Veryl Speak, PA-C  insulin aspart (NOVOLOG) 100 UNIT/ML injection Inject 2-10 Units into the skin 3 (three) times daily before meals. Sliding scale. 151-200=2 units. 201-250=4 units.  251-300= 6 units.  351-400=10 units. Greater than 400; call MD    [provider]  insulin glargine (LANTUS) 100 UNIT/ML injection Inject 20 Units into the skin at bedtime.     [provider]  meclizine (ANTIVERT) 25 MG tablet Take 1 tablet (25 mg total) by mouth 3 (three) times daily as needed for dizziness. 10/14/18   Loren Racer, MD  Melatonin 10 MG TABS Take 1 tablet by mouth at bedtime as needed (sleep).     [provider]  meloxicam (MOBIC) 15 MG tablet Take 15 mg by mouth daily as needed for muscle pain. 10/03/18   [provider]  metFORMIN (GLUCOPHAGE-XR) 500 MG 24 hr tablet Take 4 tablets (2,000 mg total) by mouth daily with breakfast. 09/05/18   Elvina Sidle, MD  methocarbamol (ROBAXIN) 500 MG tablet Take 1,000 mg by mouth 4 (four) times daily as needed for muscle pain. 10/03/18   [provider]  methylPREDNISolone (MEDROL DOSEPAK) 4 MG TBPK tablet Take 1 tablet (4 mg total) by mouth daily for 5 days. 08/02/19 08/07/19  Moshe Cipro, NP  metoprolol tartrate (LOPRESSOR) 25 MG tablet Take 12.5 mg by mouth 2 (two) times daily.     [provider]  Multiple Vitamins-Minerals (MULTIVITAMIN WITH MINERALS) tablet  Take 1 tablet by mouth daily.    [provider]  niacin 100 MG tablet Take 100 mg by mouth at bedtime.    [provider]  polyethylene glycol powder (GLYCOLAX/MIRALAX) 17 GM/SCOOP powder Take by mouth. 02/01/17   [provider]    Family History Family History  Problem Relation Age of Onset  . Hypertension Mother   . Healthy Father     Social History Social History   Tobacco Use  . Smoking status: Never Smoker  . Smokeless tobacco: Never Used  Substance Use Topics  . Alcohol use: Not Currently  . Drug use: Never     Allergies   Hydrochlorothiazide, Lisinopril, and Niaspan [niacin er]   Review of Systems Review of Systems   Physical Exam Triage Vital Signs ED Triage Vitals  Enc Vitals Group     BP 08/02/19 1539 (!) 144/75     Pulse Rate 08/02/19 1539 76     Resp 08/02/19 1539 18     Temp 08/02/19 1539 98.4 F (36.9 C)     Temp Source 08/02/19 1539 Oral     SpO2 08/02/19 1539 95 %     Weight 08/02/19 1540 265 lb (120.2 kg)     Height 08/02/19 1540 5\' 10"  (1.778 m)     Head Circumference --      Peak Flow --      Pain Score 08/02/19 1540 7     Pain Loc --      Pain Edu? --      Excl. in GC? --    No data found.  Updated Vital Signs BP (!) 144/75   Pulse 76   Temp 98.4 F (36.9 C) (Oral)   Resp 18   Ht 5\' 10"  (1.778 m)   Wt 265 lb (120.2 kg)   SpO2 95%   BMI 38.02 kg/m   Visual Acuity Right Eye Distance:   Left Eye Distance:   Bilateral Distance:    Right Eye Near:   Left Eye Near:    Bilateral Near:     Physical Exam Vitals and nursing note reviewed.  Constitutional:      General: He is not in acute distress.    Appearance: Normal appearance. He is well-developed and normal weight. He is not ill-appearing.  HENT:     Head: Normocephalic and atraumatic.     Right Ear: Tympanic membrane normal.     Left Ear: Tympanic membrane normal.     Nose: Nose normal.     Mouth/Throat:     Mouth: Mucous membranes are  moist.     Pharynx: Oropharynx is clear.  Eyes:     Extraocular Movements: Extraocular movements intact.     Conjunctiva/sclera: Conjunctivae normal.     Pupils: Pupils are equal, round, and reactive to light.  Cardiovascular:     Rate  and Rhythm: Normal rate and regular rhythm.     Heart sounds: No murmur.  Pulmonary:     Effort: Pulmonary effort is normal. No respiratory distress.     Breath sounds: Normal breath sounds.  Abdominal:     Palpations: Abdomen is soft.     Tenderness: There is no abdominal tenderness.  Musculoskeletal:     Cervical back: Normal range of motion. No rigidity.  Skin:    General: Skin is warm and dry.  Neurological:     Mental Status: He is alert.      UC Treatments / Results  Labs (all labs ordered are listed, but only abnormal results are displayed) Labs Reviewed - No data to display  EKG   Radiology No results found.  Procedures Procedures (including critical care time)  Medications Ordered in UC Medications - No data to display  Initial Impression / Assessment and Plan / UC Course  I have reviewed the triage vital signs and the nursing notes.  Pertinent labs & imaging results that were available during my care of the patient were reviewed by me and considered in my medical decision making (see chart for details).     Pharyngitis: Patient has been experiencing postnasal drip and loss of voice for the last 2 weeks.  Has been taking 5 mg Zyrtec for the last 3 days.  Discussed with patient that he may take 10 mg of Zyrtec daily during allergy season.  We will also send in a short course of steroids for him, to see if this helps resolve the sore throat laryngitis.  If he is not improving over the next week, follow-up in our office or with primary care as needed.  Follow-up in the ER for trouble swallowing, trouble breathing, other concerning symptoms.  Patient verbalized understanding agreement treatment plan. Final Clinical Impressions(s)  / UC Diagnoses   Final diagnoses:  Laryngitis     Discharge Instructions     I have sent in a 5 day course of steroids for you to take for laryngitis.  You may also take a whole tablet of the zyrtec for allergies.  Follow up  if symptoms are not improving by the end of the weekend.    ED Prescriptions    Medication Sig Dispense Auth. Provider   methylPREDNISolone (MEDROL DOSEPAK) 4 MG TBPK tablet Take 1 tablet (4 mg total) by mouth daily for 5 days. 5 tablet Faustino Congress, NP     PDMP not reviewed this encounter.   Faustino Congress, NP 08/03/19 1456

## 2020-08-12 ENCOUNTER — Ambulatory Visit (HOSPITAL_COMMUNITY)
Admission: EM | Admit: 2020-08-12 | Discharge: 2020-08-12 | Disposition: A | Discharge status: Home / Self Care | Payer: No Typology Code available for payment source | Attending: Emergency Medicine | Admitting: Emergency Medicine

## 2020-08-12 ENCOUNTER — Encounter (HOSPITAL_COMMUNITY): Payer: Self-pay | Admitting: Emergency Medicine

## 2020-08-12 ENCOUNTER — Other Ambulatory Visit: Payer: Self-pay

## 2020-08-12 DIAGNOSIS — J069 Acute upper respiratory infection, unspecified: Secondary | ICD-10-CM

## 2020-08-12 MED ORDER — BENZONATATE 100 MG PO CAPS: 100.0000 mg | ORAL_CAPSULE | Freq: Three times a day (TID) | ORAL | 0 refills | Status: AC

## 2020-08-12 MED ORDER — GUAIFENESIN-DM 100-10 MG/5ML PO SYRP: 5.0000 mL | ORAL_SOLUTION | ORAL | 0 refills | Status: AC | PRN

## 2020-08-12 MED ORDER — PSEUDOEPHEDRINE HCL ER 120 MG PO TB12: 120.0000 mg | ORAL_TABLET | Freq: Two times a day (BID) | ORAL | 0 refills | Status: AC

## 2020-08-12 NOTE — ED Triage Notes (Signed)
Onset of symptoms 3-4 days, coughing, congested.  Started getting worse yesterday.  Coughing during the night

## 2020-08-12 NOTE — ED Provider Notes (Signed)
[facility]    CSN: [id number] Arrival date & time: 08/12/20  0941      History   Chief Complaint Chief Complaint  Patient presents with  . Cough    HPI Allen Alexander is a 75 y.o. male.   Patient presents with congestion and productive cough for 4 days worsening overnight, interfering with sleep. Denies fever, chills, headache, sore throat, shortness of breath, chest pain. Has not attempted treatment.  Past Medical History:  Diagnosis Date  . Diabetes mellitus without complication (HCC)   . Hyperlipidemia   . Hypertension     Patient Active Problem List   Diagnosis Date Noted  . HLD (hyperlipidemia) 09/05/2018  . Diabetic neuropathy (HCC) 09/05/2018  . 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 Coronary artery disease involving native coronary artery 01/13/2017  . Essential hypertension 01/13/2017  . OSA (obstructive sleep apnea) 01/13/2017  . Type 2 diabetes mellitus (HCC) 01/13/2017    Past Surgical History:  Procedure Laterality Date  . ABDOMINAL SURGERY    . CARDIAC SURGERY    . KNEE SURGERY    . PACEMAKER PLACEMENT         Home Medications    Prior to Admission medications   Medication Sig Start Date End Date Taking? Authorizing Provider  aspirin 81 MG chewable tablet Chew 81 mg by mouth daily.    Yes [provider]  atorvastatin (LIPITOR) 40 MG tablet Take 40 mg by mouth daily.   Yes [provider]  benzonatate (TESSALON) 100 MG capsule Take 1 capsule (100 mg total) by mouth every 8 (eight) hours. 08/12/20  Yes Myranda Pavone, Elita Boone, NP  Cholecalciferol (VITAMIN D) 2000 units CAPS Take 2,000 Units by mouth every morning.   Yes [provider]  guaiFENesin-dextromethorphan (ROBITUSSIN DM) 100-10 MG/5ML syrup Take 5 mLs by mouth every 4 (four) hours as needed for cough. 08/12/20  Yes Waylon Hershey R, NP  insulin aspart (NOVOLOG) 100 UNIT/ML injection Inject 2-10 Units into the skin 3 (three) times daily before meals. Sliding scale.  151-200=2 units. 201-250=4 units.  251-300= 6 units.  351-400=10 units. Greater than 400; call MD   Yes [provider]  insulin glargine (LANTUS) 100 UNIT/ML injection Inject 20 Units into the skin at bedtime.    Yes [provider]  meloxicam (MOBIC) 15 MG tablet Take 15 mg by mouth daily as needed for muscle pain. 10/03/18  Yes [provider]  metFORMIN (GLUCOPHAGE-XR) 500 MG 24 hr tablet Take 4 tablets (2,000 mg total) by mouth daily with breakfast. 09/05/18  Yes Elvina Sidle, MD  metoprolol tartrate (LOPRESSOR) 25 MG tablet Take 12.5 mg by mouth 2 (two) times daily.    Yes [provider]  Multiple Vitamins-Minerals (MULTIVITAMIN WITH MINERALS) tablet Take 1 tablet by mouth daily.   Yes [provider]  niacin 100 MG tablet Take 100 mg by mouth at bedtime.   Yes [provider]  pseudoephedrine (SUDAFED 12 HOUR) 120 MG 12 hr tablet Take 1 tablet (120 mg total) by mouth 2 (two) times daily. 08/12/20  Yes Ghalia Reicks R, NP  Benzocaine-Menthol (CEPACOL SORE THROAT) 10-2.1 MG LOZG Use as directed 1 lozenge in the mouth or throat every 4 (four) hours as needed. 07/30/19   Darr, Gerilyn Pilgrim, PA-C  cetirizine (ZYRTEC ALLERGY) 10 MG tablet Take 0.5 tablets (5 mg total) by mouth daily. 07/30/19 08/29/19  Darr, Gerilyn Pilgrim, PA-C  diphenhydrAMINE (BENADRYL) 25 MG tablet Take 25 mg by mouth every 6 (six) hours as  needed for itching or allergies.     [provider]  doxycycline (VIBRA-TABS) 100 MG tablet Take 1 tablet (100 mg total) by mouth 2 (two) times daily. Patient not taking: Reported on 10/14/2018 09/05/18   Elvina Sidle, MD  fluticasone [facility]) 50 MCG/ACT nasal spray Place 1 spray into both nostrils daily. 07/30/19   Darr, Gerilyn Pilgrim, PA-C  meclizine (ANTIVERT) 25 MG tablet Take 1 tablet (25 mg total) by mouth 3 (three) times daily as needed for dizziness. 10/14/18   Loren Racer, MD  Melatonin 10 MG TABS Take 1 tablet by mouth at bedtime as needed  (sleep).     [provider]  methocarbamol (ROBAXIN) 500 MG tablet Take 1,000 mg by mouth 4 (four) times daily as needed for muscle pain. 10/03/18   [provider]  polyethylene glycol powder (GLYCOLAX/MIRALAX) 17 GM/SCOOP powder Take by mouth. 02/01/17   [provider]    Family History Family History  Problem Relation Age of Onset  . Hypertension Mother   . Healthy Father     Social History Social History   Tobacco Use  . Smoking status: Never Smoker  . Smokeless tobacco: Never Used  Vaping Use  . Vaping Use: Never used  Substance Use Topics  . Alcohol use: Not Currently  . Drug use: Never     Allergies   Hydrochlorothiazide, Lisinopril, and Niaspan [niacin er]   Review of Systems Review of Systems  Constitutional: Negative.   HENT: Positive for congestion. Negative for dental problem, drooling, ear discharge, ear pain, facial swelling, hearing loss, mouth sores, nosebleeds, postnasal drip, rhinorrhea, sinus pressure, sinus pain, sneezing, sore throat, tinnitus, trouble swallowing and voice change.   Respiratory: Positive for cough. Negative for apnea, choking, chest tightness, shortness of breath, wheezing and stridor.   Cardiovascular: Negative.   Gastrointestinal: Negative.   Skin: Negative.   Neurological: Negative.      Physical Exam Triage Vital Signs ED Triage Vitals  Enc Vitals Group     BP 08/12/20 1132 (!) 126/54     Pulse Rate 08/12/20 1132 71     Resp 08/12/20 1132 (!) 21     Temp 08/12/20 1132 98.1 F (36.7 C)     Temp Source 08/12/20 1132 Oral     SpO2 08/12/20 1132 93 %     Weight --      Height --      Head Circumference --      Peak Flow --      Pain Score 08/12/20 1126 0     Pain Loc --      Pain Edu? --      Excl. in GC? --    No data found.  Updated Vital Signs BP (!) 126/54 (BP Location: Right Arm) Comment (BP Location): large cuff  Pulse 71   Temp 98.1 F (36.7 C) (Oral)   Resp (!) 21   SpO2 93%    Visual Acuity Right Eye Distance:   Left Eye Distance:   Bilateral Distance:    Right Eye Near:   Left Eye Near:    Bilateral Near:     Physical Exam Constitutional:      Appearance: Normal appearance. He is normal weight.  HENT:     Head: Normocephalic.     Right Ear: Ear canal and external ear normal. A middle ear effusion is present.     Left Ear: Ear canal and external ear normal. A middle ear effusion is present.  Nose: Congestion present. No rhinorrhea.     Mouth/Throat:     Mouth: Mucous membranes are moist.     Pharynx: Oropharynx is clear.  Eyes:     Extraocular Movements: Extraocular movements intact.     Conjunctiva/sclera: Conjunctivae normal.     Pupils: Pupils are equal, round, and reactive to light.  Cardiovascular:     Rate and Rhythm: Normal rate and regular rhythm.     Pulses: Normal pulses.     Heart sounds: Normal heart sounds.  Pulmonary:     Effort: Pulmonary effort is normal.     Breath sounds: Normal breath sounds.  Musculoskeletal:        General: Normal range of motion.     Cervical back: Normal range of motion.  Lymphadenopathy:     Cervical: Cervical adenopathy present.  Skin:    General: Skin is warm and dry.  Neurological:     Mental Status: He is alert and oriented to person, place, and time. Mental status is at baseline.  Psychiatric:        Mood and Affect: Mood normal.        Behavior: Behavior normal.        Thought Content: Thought content normal.        Judgment: Judgment normal.      UC Treatments / Results  Labs (all labs ordered are listed, but only abnormal results are displayed) Labs Reviewed - No data to display  EKG   Radiology No results found.  Procedures Procedures (including critical care time)  Medications Ordered in UC Medications - No data to display  Initial Impression / Assessment and Plan / UC Course  I have reviewed the triage vital signs and the nursing notes.  Pertinent labs & imaging  results that were available during my care of the patient were reviewed by me and considered in my medical decision making (see chart for details).  Viral URI with cough  1. Tessalon 100 mg tid prn 2. Robitussin DM 58mL every 4 hours prn 3. Sudafed 120 mg bid for 3 days Final Clinical Impressions(s) / UC Diagnoses   Final diagnoses:  Viral URI with cough     Discharge Instructions     Can use tessalon pill every 8 hours as needed for cough  Can use cough syrup 5 mL every 4 hours as needed for cough  Can use sudafed twice a day for the next three days to help with congestion  Can continue to use albuterol inhaler as needed to help with congestion and breathing   ED Prescriptions    Medication Sig Dispense Auth. Provider   benzonatate (TESSALON) 100 MG capsule Take 1 capsule (100 mg total) by mouth every 8 (eight) hours. 21 capsule Sunny Aguon R, NP   guaiFENesin-dextromethorphan (ROBITUSSIN DM) 100-10 MG/5ML syrup Take 5 mLs by mouth every 4 (four) hours as needed for cough. 118 mL Nainoa Woldt R, NP   pseudoephedrine (SUDAFED 12 HOUR) 120 MG 12 hr tablet Take 1 tablet (120 mg total) by mouth 2 (two) times daily. 6 tablet Valinda Hoar, NP     PDMP not reviewed this encounter.   Valinda Hoar, NP 08/12/20 1357

## 2020-08-12 NOTE — Discharge Instructions (Addendum)
Can use tessalon pill every 8 hours as needed for cough  Can use cough syrup 5 mL every 4 hours as needed for cough  Can use sudafed twice a day for the next three days to help with congestion  Can continue to use albuterol inhaler as needed to help with congestion and breathing
